# Patient Record
Sex: Female | Born: 1990 | Race: Black or African American | Hispanic: No | Marital: Married | Smoking: Never smoker
Health system: Southern US, Community
[De-identification: ages and names within clinical notes are randomized; demographics above are authoritative.]

## PROBLEM LIST (undated history)

## (undated) DIAGNOSIS — T7840XA Allergy, unspecified, initial encounter: Secondary | ICD-10-CM

## (undated) DIAGNOSIS — K2970 Gastritis, unspecified, without bleeding: Secondary | ICD-10-CM

## (undated) DIAGNOSIS — F419 Anxiety disorder, unspecified: Secondary | ICD-10-CM

## (undated) DIAGNOSIS — K219 Gastro-esophageal reflux disease without esophagitis: Secondary | ICD-10-CM

## (undated) HISTORY — DX: Gastro-esophageal reflux disease without esophagitis: K21.9

## (undated) HISTORY — DX: Allergy, unspecified, initial encounter: T78.40XA

## (undated) HISTORY — PX: COLONOSCOPY: SHX174

## (undated) HISTORY — DX: Anxiety disorder, unspecified: F41.9

## (undated) HISTORY — DX: Gastritis, unspecified, without bleeding: K29.70

---

## 2013-01-25 ENCOUNTER — Ambulatory Visit: Payer: Self-pay | Admitting: Gastroenterology

## 2017-09-05 DIAGNOSIS — K219 Gastro-esophageal reflux disease without esophagitis: Secondary | ICD-10-CM | POA: Insufficient documentation

## 2017-09-08 ENCOUNTER — Other Ambulatory Visit: Payer: Self-pay | Admitting: Family Medicine

## 2017-09-08 DIAGNOSIS — R1011 Right upper quadrant pain: Secondary | ICD-10-CM

## 2017-09-12 ENCOUNTER — Ambulatory Visit (HOSPITAL_COMMUNITY)
Admission: RE | Admit: 2017-09-12 | Discharge: 2017-09-12 | Disposition: A | Discharge status: Home / Self Care | Payer: Managed Care, Other (non HMO) | Source: Ambulatory Visit | Attending: Family Medicine | Admitting: Family Medicine

## 2017-09-12 DIAGNOSIS — R93421 Abnormal radiologic findings on diagnostic imaging of right kidney: Secondary | ICD-10-CM | POA: Diagnosis not present

## 2017-09-12 DIAGNOSIS — R1011 Right upper quadrant pain: Secondary | ICD-10-CM | POA: Insufficient documentation

## 2017-09-23 ENCOUNTER — Other Ambulatory Visit (HOSPITAL_COMMUNITY): Payer: Self-pay | Admitting: Family Medicine

## 2017-09-23 DIAGNOSIS — N200 Calculus of kidney: Secondary | ICD-10-CM

## 2017-12-31 ENCOUNTER — Telehealth: Payer: Self-pay | Admitting: Family Medicine

## 2017-12-31 NOTE — Telephone Encounter (Signed)
Copied from [place] [id number]. Topic: Quick Communication - See Telephone Encounter >> Dec 31, 2017  9:39 AM Gardiner Ramus wrote: CRM for notification. See Telephone encounter for: 12/31/17. She would like to become a pt of [facility]. She needed to get in July 23rd or 24th. Please advise [phone]

## 2017-12-31 NOTE — Telephone Encounter (Signed)
Contact patient to schedule in the next available slot or offer another provider availability.

## 2018-01-13 ENCOUNTER — Ambulatory Visit (HOSPITAL_COMMUNITY): Payer: Managed Care, Other (non HMO)

## 2018-01-21 ENCOUNTER — Encounter (HOSPITAL_COMMUNITY): Payer: Self-pay

## 2018-01-21 ENCOUNTER — Ambulatory Visit (HOSPITAL_COMMUNITY)
Admission: RE | Admit: 2018-01-21 | Discharge: 2018-01-21 | Disposition: A | Discharge status: Home / Self Care | Payer: Managed Care, Other (non HMO) | Source: Ambulatory Visit | Attending: Family Medicine | Admitting: Family Medicine

## 2018-01-21 DIAGNOSIS — N200 Calculus of kidney: Secondary | ICD-10-CM | POA: Diagnosis not present

## 2018-02-03 ENCOUNTER — Encounter: Payer: Self-pay | Admitting: Urology

## 2018-02-03 ENCOUNTER — Ambulatory Visit: Payer: Managed Care, Other (non HMO) | Admitting: Urology

## 2018-02-03 DIAGNOSIS — N200 Calculus of kidney: Secondary | ICD-10-CM

## 2018-02-03 LAB — URINALYSIS, COMPLETE
BILIRUBIN UA: NEGATIVE
Glucose, UA: NEGATIVE
Ketones, UA: NEGATIVE
LEUKOCYTES UA: NEGATIVE
Nitrite, UA: NEGATIVE
PH UA: 7 (ref 5.0–7.5)
PROTEIN UA: NEGATIVE
RBC UA: NEGATIVE
Specific Gravity, UA: 1.01 (ref 1.005–1.030)
Urobilinogen, Ur: 0.2 mg/dL (ref 0.2–1.0)

## 2018-02-03 LAB — MICROSCOPIC EXAMINATION: RBC, UA: NONE SEEN /hpf (ref 0–2)

## 2018-02-03 NOTE — Progress Notes (Signed)
   02/03/2018 1:03 PM   [name] [birth year] [id number]  Referring provider: Sharyne Peach, MD [place], [place] [address]  CC: Chronic right abdominal pain  HPI: Kathryn Allen is a 27 year old female referred from Dr. Iona Beard in consultation for right-sided abdominal pain and non-obstructing right renal stones on CT scan.  Briefly she reports an approximately 6-8 month history of intermittent right-sided abdominal pain.  She reports it feels like a "gas pain," and is worse with certain foods, as well as when she is on her menstrual cycle.  Severity is moderate, and the pain is intermittent.  Prior work-up included an abdominal ultrasound that showed no hydronephrosis and no gallbladder pathology.  She also had a CT without contrast on July 31 that showed multiple very small right renal stones, largest 45mm mid-pole, and no hydronephrosis or ureteral stones.  She denies any hematuria or family history of kidney stones.  She denies gross hematuria or other urinary symptoms.   PMH: Past Medical History:  Diagnosis Date  . Anxiety   . GERD (gastroesophageal reflux disease)     Surgical History: History reviewed. No pertinent surgical history.   Allergies: No Known Allergies  Family History: Family History  Problem Relation Age of Onset  . Bladder Cancer Neg Hx   . Kidney cancer Neg Hx     Social History: Denies EtOH and smoking  ROS: Please see flowsheet from today's date for complete review of systems.  Physical Exam: LMP 12/29/2017 (Exact Date)   Constitutional:  Alert and oriented, No acute distress. Cardiovascular: No clubbing, cyanosis, or edema. Respiratory: Normal respiratory effort, no increased work of breathing. GI: Abdomen is soft, nontender, nondistended, no abdominal masses GU: No space CVA tenderness Lymph: No cervical or inguinal lymphadenopathy. Skin: No rashes, bruises or suspicious lesions. Neurologic: Grossly intact, no focal  deficits, moving all 4 extremities. Psychiatric: Normal mood and affect.  Laboratory Data: Urinalysis today is benign, 0-5 WBCs, 0 RBCs, 0-10 epithelial cells, few bacteria, nitrite negative  Pertinent Imaging:  I have personally reviewed the CT flank, notable for no hydronephrosis or ureteral stones.  There are non-obstructing right renal stones, the largest is approximately 4 mm and in the midpole.  Assessment & Plan:   In summary, Kathryn Allen is a 27 year old female with chronic right-sided abdominal pain and multiple very small nonobstructing right renal stones.  We had a long discussion today about the etiology of her pain, and I feel strongly that these nonobstructing stones are unrelated.  I did recommend considering a pelvic or vaginal ultrasound during one of her pain episodes, especially since these tend to occur during her menstrual cycle.  Additionally, if that is negative she could consider further work-up with GI if her pain continues to be associated with certain foods.  Finally, we did discuss elective right-sided ureteroscopy with laser lithotripsy and stent placement in the future if she continued to have ongoing right-sided pain without other identified etiology.  However, I feel the risks currently outweigh any benefits.  She will follow-up on an as-needed basis  Billey Co 02/03/2018  [facility] [address], [place], [place] [address] [phone]

## 2018-02-18 ENCOUNTER — Ambulatory Visit: Payer: Managed Care, Other (non HMO) | Admitting: Family Medicine

## 2018-08-14 ENCOUNTER — Encounter: Payer: Self-pay | Admitting: Obstetrics and Gynecology

## 2018-08-27 ENCOUNTER — Encounter: Payer: Self-pay | Admitting: Obstetrics and Gynecology

## 2018-08-27 ENCOUNTER — Other Ambulatory Visit: Payer: Self-pay

## 2018-08-27 ENCOUNTER — Ambulatory Visit: Payer: Managed Care, Other (non HMO) | Admitting: Obstetrics and Gynecology

## 2018-08-27 VITALS — BP 122/80 | HR 72 | Resp 14 | Ht 62.75 in | Wt 142.2 lb

## 2018-08-27 DIAGNOSIS — Z01419 Encounter for gynecological examination (general) (routine) without abnormal findings: Secondary | ICD-10-CM | POA: Diagnosis not present

## 2018-08-27 DIAGNOSIS — Z3009 Encounter for other general counseling and advice on contraception: Secondary | ICD-10-CM

## 2018-08-27 MED ORDER — NORETHIN ACE-ETH ESTRAD-FE 1-20 MG-MCG PO TABS: 1.0000 | ORAL_TABLET | Freq: Every day | ORAL | 3 refills | Status: DC

## 2018-08-27 NOTE — Progress Notes (Signed)
28 y.o. G0P0000 Single Black or African American Not Hispanic or Latino female here for new patient exam. Patient would like to discuss birth control options as she is getting married in November.  Period Cycle (Days): 28 Period Duration (Days): 4-5  Period Pattern: Regular Menstrual Flow: Moderate, Heavy Menstrual Control: Maxi pad Menstrual Control Change Freq (Hours): changes pad 3-4x a day  Dysmenorrhea: (!) Moderate Dysmenorrhea Symptoms: Cramping, Nausea, Other (Comment)(dizziness with some cycles) Cramps can be bad.  She c/o a 14 month h/o intermittent RLQ pain, can get worse with her cycle. Felt like trapped gas. Changes with her diet. She gets it daily, very mild.  BM are normal to constipated. She sometimes has bleeding with BM. She has a h/o a colonoscopy and has internal and external hemorrhoid.  Patient's last menstrual period was 08/07/2018.          She hasn't been sexually active for 5 years.  Sexually active: No.  The current method of family planning is abstinence.    Exercising: No.  The patient does not participate in regular exercise at present. Smoker:  no  Health Maintenance: Pap:  08-01-17 negative  History of abnormal Pap:  no MMG:  n/a BMD:   n/a Colonoscopy: 01-25-13 due to GI issues TDaP:  05-11-14  Gardasil: completed x3   reports that she has never smoked. She has never used smokeless tobacco. She reports that she does not drink alcohol or use drugs. She works in Environmental health practitioner.  Past Medical History:  Diagnosis Date  . Anxiety   . GERD (gastroesophageal reflux disease)     History reviewed. No pertinent surgical history.  Current Outpatient Medications  Medication Sig Dispense Refill  . cetirizine (ZYRTEC) 10 MG tablet     . EPINEPHrine 0.3 mg/0.3 mL IJ SOAJ injection USE AS DIRECTED    . IBUPROFEN PO Take by mouth.    . Multiple Vitamins-Minerals (HAIR SKIN NAILS PO) Take by mouth.    . Multiple Vitamins-Minerals (MULTIVITAMIN PO) Take  by mouth.     No current facility-administered medications for this visit.     Family History  Problem Relation Age of Onset  . Breast cancer Maternal Grandmother   . Bladder Cancer Neg Hx   . Kidney cancer Neg Hx   MGM died at 10. She had sisters. 2 daughters, everyone else is fine  Review of Systems  Constitutional: Negative.   HENT: Negative.   Eyes: Negative.   Respiratory: Negative.   Cardiovascular: Negative.   Gastrointestinal: Positive for abdominal pain.       Abdominal pain that worsens with cycle  Endocrine: Negative.   Genitourinary: Negative.   Allergic/Immunologic: Negative.   Neurological: Negative.   Hematological: Negative.   Psychiatric/Behavioral: Negative.   She reports intermittent boils under her arms, not currently.   Exam:   BP 122/80 (BP Location: Right Arm, Patient Position: Sitting, Cuff Size: Normal)   Pulse 72   Resp 14   Ht 5' 2.75" (1.594 m)   Wt 142 lb 4 oz (64.5 kg)   LMP 08/07/2018   BMI 25.40 kg/m   Weight change: @WEIGHTCHANGE @ Height:   Height: 5' 2.75" (159.4 cm)  Ht Readings from Last 3 Encounters:  08/27/18 5' 2.75" (1.594 m)    General appearance: alert, cooperative and appears stated age Head: Normocephalic, without obvious abnormality, atraumatic Neck: no adenopathy, supple, symmetrical, trachea midline and thyroid normal to inspection and palpation Lungs: clear to auscultation bilaterally Cardiovascular: regular rate and rhythm Breasts: normal  appearance, no masses or tenderness Abdomen: soft, non-tender; non distended,  no masses,  no organomegaly Extremities: extremities normal, atraumatic, no cyanosis or edema Skin: Skin color, texture, turgor normal. No rashes or lesions Lymph nodes: Cervical, supraclavicular, and axillary nodes normal. No abnormal inguinal nodes palpated Neurologic: Grossly normal   Pelvic: External genitalia:  no lesions              Urethra:  normal appearing urethra with no masses, tenderness  or lesions              Bartholins and Skenes: normal                 Vagina: normal appearing vagina with normal color and discharge, no lesions              Cervix: no lesions               Bimanual Exam:  Uterus:  normal size, contour, position, consistency, mobility, non-tender and retroverted              Adnexa: no mass, fullness, tenderness               Rectovaginal: Confirms               Anus:  normal sphincter tone, no lesions  Chaperone was present for exam.  A:  Well Woman with normal exam  RLQ abdominal pain, intermittent, suspect GI  P:   Labs at work  No pap this year  Declines STD testing  Will start OCP's, no contraindications, risks reviewed  F/U in 3 months  Discussed breast self exam  Discussed calcium and vit D intake

## 2018-08-27 NOTE — Patient Instructions (Signed)
EXERCISE AND DIET:  We recommended that you start or continue a regular exercise program for good health. Regular exercise means any activity that makes your heart beat faster and makes you sweat.  We recommend exercising at least 30 minutes per day at least 3 days a week, preferably 4 or 5.  We also recommend a diet low in fat and sugar.  Inactivity, poor dietary choices and obesity can cause diabetes, heart attack, stroke, and kidney damage, among others.    ALCOHOL AND SMOKING:  Women should limit their alcohol intake to no more than 7 drinks/beers/glasses of wine (combined, not each!) per week. Moderation of alcohol intake to this level decreases your risk of breast cancer and liver damage. And of course, no recreational drugs are part of a healthy lifestyle.  And absolutely no smoking or even second hand smoke. Most people know smoking can cause heart and lung diseases, but did you know it also contributes to weakening of your bones? Aging of your skin?  Yellowing of your teeth and nails?  CALCIUM AND VITAMIN D:  Adequate intake of calcium and Vitamin D are recommended.  The recommendations for exact amounts of these supplements seem to change often, but generally speaking 1,000 mg of calcium (between diet and supplement) and 800 units of Vitamin D per day seems prudent. Certain women may benefit from higher intake of Vitamin D.  If you are among these women, your doctor will have told you during your visit.    PAP SMEARS:  Pap smears, to check for cervical cancer or precancers,  have traditionally been done yearly, although recent scientific advances have shown that most women can have pap smears less often.  However, every woman still should have a physical exam from her gynecologist every year. It will include a breast check, inspection of the vulva and vagina to check for abnormal growths or skin changes, a visual exam of the cervix, and then an exam to evaluate the size and shape of the uterus and  ovaries.  And after 29 years of age, a rectal exam is indicated to check for rectal cancers. We will also provide age appropriate advice regarding health maintenance, like when you should have certain vaccines, screening for sexually transmitted diseases, bone density testing, colonoscopy, mammograms, etc.   MAMMOGRAMS:  All women over 40 years old should have a yearly mammogram. Many facilities now offer a "3D" mammogram, which may cost around $50 extra out of pocket. If possible,  we recommend you accept the option to have the 3D mammogram performed.  It both reduces the number of women who will be called back for extra views which then turn out to be normal, and it is better than the routine mammogram at detecting truly abnormal areas.    COLON CANCER SCREENING: Now recommend starting at age 45. At this time colonoscopy is not covered for routine screening until 50. There are take home tests that can be done between 45-49.   COLONOSCOPY:  Colonoscopy to screen for colon cancer is recommended for all women at age 50.  We know, you hate the idea of the prep.  We agree, BUT, having colon cancer and not knowing it is worse!!  Colon cancer so often starts as a polyp that can be seen and removed at colonscopy, which can quite literally save your life!  And if your first colonoscopy is normal and you have no family history of colon cancer, most women don't have to have it again for   10 years.  Once every ten years, you can do something that may end up saving your life, right?  We will be happy to help you get it scheduled when you are ready.  Be sure to check your insurance coverage so you understand how much it will cost.  It may be covered as a preventative service at no cost, but you should check your particular policy.      Breast Self-Awareness Breast self-awareness means being familiar with how your breasts look and feel. It involves checking your breasts regularly and reporting any changes to your  health care provider. Practicing breast self-awareness is important. A change in your breasts can be a sign of a serious medical problem. Being familiar with how your breasts look and feel allows you to find any problems early, when treatment is more likely to be successful. All women should practice breast self-awareness, including women who have had breast implants. How to do a breast self-exam One way to learn what is normal for your breasts and whether your breasts are changing is to do a breast self-exam. To do a breast self-exam: Look for Changes  1. Remove all the clothing above your waist. 2. Stand in front of a mirror in a room with good lighting. 3. Put your hands on your hips. 4. Push your hands firmly downward. 5. Compare your breasts in the mirror. Look for differences between them (asymmetry), such as: ? Differences in shape. ? Differences in size. ? Puckers, dips, and bumps in one breast and not the other. 6. Look at each breast for changes in your skin, such as: ? Redness. ? Scaly areas. 7. Look for changes in your nipples, such as: ? Discharge. ? Bleeding. ? Dimpling. ? Redness. ? A change in position. Feel for Changes Carefully feel your breasts for lumps and changes. It is best to do this while lying on your back on the floor and again while sitting or standing in the shower or tub with soapy water on your skin. Feel each breast in the following way:  Place the arm on the side of the breast you are examining above your head.  Feel your breast with the other hand.  Start in the nipple area and make  inch (2 cm) overlapping circles to feel your breast. Use the pads of your three middle fingers to do this. Apply light pressure, then medium pressure, then firm pressure. The light pressure will allow you to feel the tissue closest to the skin. The medium pressure will allow you to feel the tissue that is a little deeper. The firm pressure will allow you to feel the tissue  close to the ribs.  Continue the overlapping circles, moving downward over the breast until you feel your ribs below your breast.  Move one finger-width toward the center of the body. Continue to use the  inch (2 cm) overlapping circles to feel your breast as you move slowly up toward your collarbone.  Continue the up and down exam using all three pressures until you reach your armpit.  Write Down What You Find  Write down what is normal for each breast and any changes that you find. Keep a written record with breast changes or normal findings for each breast. By writing this information down, you do not need to depend only on memory for size, tenderness, or location. Write down where you are in your menstrual cycle, if you are still menstruating. If you are having trouble noticing differences   in your breasts, do not get discouraged. With time you will become more familiar with the variations in your breasts and more comfortable with the exam. How often should I examine my breasts? Examine your breasts every month. If you are breastfeeding, the best time to examine your breasts is after a feeding or after using a breast pump. If you menstruate, the best time to examine your breasts is 5-7 days after your period is over. During your period, your breasts are lumpier, and it may be more difficult to notice changes. When should I see my health care provider? See your health care provider if you notice:  A change in shape or size of your breasts or nipples.  A change in the skin of your breast or nipples, such as a reddened or scaly area.  Unusual discharge from your nipples.  A lump or thick area that was not there before.  Pain in your breasts.  Anything that concerns you.  Oral Contraception Information Oral contraceptive pills (OCPs) are medicines taken to prevent pregnancy. OCPs are taken by mouth, and they work by:  Preventing the ovaries from releasing eggs.  Thickening mucus in  the lower part of the uterus (cervix), which prevents sperm from entering the uterus.  Thinning the lining of the uterus (endometrium), which prevents a fertilized egg from attaching to the endometrium. OCPs are highly effective when taken exactly as prescribed. However, OCPs do not prevent STIs (sexually transmitted infections). Safe sex practices, such as using condoms while on an OCP, can help prevent STIs. Before starting OCPs Before you start taking OCPs, you may have a physical exam, blood test, and Pap test. However, you are not required to have a pelvic exam in order to be prescribed OCPs. Your health care provider will make sure you are a good candidate for oral contraception. OCPs are not a good option for certain women, including women who smoke and are older than 35 years, and women with a medical history of high blood pressure, deep vein thrombosis, pulmonary embolism, stroke, cardiovascular disease, or peripheral vascular disease. Discuss with your health care provider the possible side effects of the OCP you may be prescribed. When you start an OCP, be aware that it can take 2-3 months for your body to adjust to changes in hormone levels. Follow instructions from your health care provider about how to start taking your first cycle of OCPs. Depending on when you start the pill, you may need to use a backup form of birth control, such as condoms, during the first week. Make sure you know what steps to take if you ever forget to take the pill. Types of oral contraception  The most common types of birth control pills contain the hormones estrogen and progestin (synthetic progesterone) or progestin only. The combination pill This type of pill contains estrogen and progestin hormones. Combination pills often come in packs of 21, 28, or 91 pills. For each pack, the last 7 pills may not contain hormones, which means you may stop taking the pills for 7 days. Menstrual bleeding occurs during the  week that you do not take the pills or that you take the pills with no hormones in them. The minipill This type of pill contains the progestin hormone only. It comes in packs of 28 pills. All 28 pills contain the hormone. You take the pill every day. It is very important to take the pill at the same time each day. Advantages of oral contraceptive pills    Provides reliable and continuous contraception if taken as instructed. °· May treat or decrease symptoms of: °? Menstrual period cramps. °? Irregular menstrual cycle or bleeding. °? Heavy menstrual flow. °? Abnormal uterine bleeding. °? Acne, depending on the type of pill. °? Polycystic ovarian syndrome. °? Endometriosis. °? Iron deficiency anemia. °? Premenstrual symptoms, including premenstrual dysphoric disorder. °· May reduce the risk of endometrial and ovarian cancer. °· Can be used as emergency contraception. °· Prevents mislocated (ectopic) pregnancies and infections of the fallopian tubes. °Things that can make oral contraceptive pills less effective °OCPs can be less effective if: °· You forget to take the pill at the same time every day. This is especially important when taking the minipill. °· You have a stomach or intestinal disease that reduces your body's ability to absorb the pill. °· You take OCPs with other medicines that make OCPs less effective, such as antibiotics, certain HIV medicines, and some seizure medicines. °· You take expired OCPs. °· You forget to restart the pill on day 7, if using the packs of 21 pills. °Risks associated with oral contraceptive pills °Oral contraceptive pills can sometimes cause side effects, such as: °· Headache. °· Depression. °· Trouble sleeping. °· Nausea and vomiting. °· Breast tenderness. °· Irregular bleeding or spotting during the first several months. °· Bloating or fluid retention. °· Increase in blood pressure. °Combination pills are also associated with a small increase in the risk of: °· Blood  clots. °· Heart attack. °· Stroke. °Summary °· Oral contraceptive pills are medicines taken by mouth to prevent pregnancy. They are highly effective when taken exactly as prescribed. °· The most common types of birth control pills contain the hormones estrogen and progestin (synthetic progesterone) or progestin only. °· Before you start taking the pill, you may have a physical exam, blood test, and Pap test. Your health care provider will make sure you are a good candidate for oral contraception. °· The combination pill may come in a 21-day pack, a 28-day pack, or a 91-day pack. The minipill contains the progesterone hormone only and comes in packs of 28 pills. °· Oral contraceptive pills can sometimes cause side effects, such as headache, nausea, breast tenderness, or irregular bleeding. °This information is not intended to replace advice given to you by your health care provider. Make sure you discuss any questions you have with your health care provider. °Document Released: 08/31/2002 Document Revised: 09/03/2016 Document Reviewed: 09/03/2016 °Elsevier Interactive Patient Education © 2019 [employer] ° °

## 2018-11-27 ENCOUNTER — Telehealth: Payer: Self-pay | Admitting: Obstetrics and Gynecology

## 2018-11-27 NOTE — Telephone Encounter (Signed)
Spoke with patient and advised to keep 3 month follow up appointment after starting OCPs. Advised patient we need to recheck vitals to be sure they are not elevated with OCPs. She thanked me for returning her call.

## 2018-11-27 NOTE — Telephone Encounter (Signed)
Patient has a 3 month medication follow up appointment 12/02/18. She asked if she "really needs this appointment or can this be handled over the phone"?Marland Kitchen

## 2018-11-30 NOTE — Progress Notes (Signed)
GYNECOLOGY  VISIT   HPI: 28 y.o.   Single Black or African American Not Hispanic or Latino  female   G0P0000 with Patient's last menstrual period was 11/22/2018 (approximate).   here for 3 month follow up on OCP. She is getting married in 11/20 and doesn't plan to be sexually active until then.  Cycles are monthly x 4 day, saturating a pad every 4 hours (lighter). Cramps are still bad for the first 1-2 days, controlled with ibuprofen.  She continues to have constipation with her cycle, feels weak and fatigued for a couple of days of her cycle.  She has had BTB the last 2 cycles, but has taken her pills late. She was having some issues with taking her pill at the same time. She has set an alarm and is trying to take it more consistently. No other side effects.   GYNECOLOGIC HISTORY: Patient's last menstrual period was 11/22/2018 (approximate). Contraception: OCP  Menopausal hormone therapy: None        OB History    Gravida  0   Para  0   Term  0   Preterm  0   AB  0   Living  0     SAB  0   TAB  0   Ectopic  0   Multiple  0   Live Births  0              There are no active problems to display for this patient.   Past Medical History:  Diagnosis Date  . Anxiety   . GERD (gastroesophageal reflux disease)     History reviewed. No pertinent surgical history.  Current Outpatient Medications  Medication Sig Dispense Refill  . cetirizine (ZYRTEC) 10 MG tablet     . EPINEPHrine 0.3 mg/0.3 mL IJ SOAJ injection USE AS DIRECTED    . IBUPROFEN PO Take by mouth.    . norethindrone-ethinyl estradiol (JUNEL FE,GILDESS FE,LOESTRIN FE) 1-20 MG-MCG tablet Take 1 tablet by mouth daily. 3 Package 3   No current facility-administered medications for this visit.      ALLERGIES: Shellfish allergy  Family History  Problem Relation Age of Onset  . Breast cancer Maternal Grandmother   . Bladder Cancer Neg Hx   . Kidney cancer Neg Hx     Social History    Socioeconomic History  . Marital status: Single    Spouse name: Not on file  . Number of children: Not on file  . Years of education: Not on file  . Highest education level: Not on file  Occupational History  . Not on file  Social Needs  . Financial resource strain: Not on file  . Food insecurity:    Worry: Not on file    Inability: Not on file  . Transportation needs:    Medical: Not on file    Non-medical: Not on file  Tobacco Use  . Smoking status: Never Smoker  . Smokeless tobacco: Never Used  Substance and Sexual Activity  . Alcohol use: Never    Frequency: Never  . Drug use: Never  . Sexual activity: Not Currently    Birth control/protection: Abstinence, Pill  Lifestyle  . Physical activity:    Days per week: Not on file    Minutes per session: Not on file  . Stress: Not on file  Relationships  . Social connections:    Talks on phone: Not on file    Gets together: Not on file  Attends religious service: Not on file    Active member of club or organization: Not on file    Attends meetings of clubs or organizations: Not on file    Relationship status: Not on file  . Intimate partner violence:    Fear of current or ex partner: Not on file    Emotionally abused: Not on file    Physically abused: Not on file    Forced sexual activity: Not on file  Other Topics Concern  . Not on file  Social History Narrative  . Not on file    Review of Systems  Constitutional: Negative.   HENT: Negative.   Eyes: Negative.   Respiratory: Negative.   Cardiovascular: Negative.   Gastrointestinal: Negative.   Genitourinary:       Spotting before menses  Musculoskeletal: Negative.   Skin: Negative.   Neurological: Negative.   Endo/Heme/Allergies: Negative.   Psychiatric/Behavioral: Negative.     PHYSICAL EXAMINATION:    BP 118/82 (BP Location: Left Arm, Patient Position: Sitting, Cuff Size: Normal)   Pulse 72   Temp 97.9 F (36.6 C) (Skin)   Wt 146 lb 9.6 oz  (66.5 kg)   LMP 11/22/2018 (Approximate)   BMI 26.18 kg/m     General appearance: alert, cooperative and appears stated age  ASSESSMENT Contraception surveillance Dysmenorrhea    PLAN Continue OCP's Will call in Ibuprofen 800 mg F/U annual 3/21 She is getting married in 11/20, wants to adjust her cycle to not be bleeding during her wedding (she will call a few months prior, we discussed how to adjust her cycle, may need an extra pack of pills)   An After Visit Summary was printed and given to the patient.  ~20 minutes face to face time of which over 50% was spent in counseling.

## 2018-12-02 ENCOUNTER — Encounter: Payer: Self-pay | Admitting: Obstetrics and Gynecology

## 2018-12-02 ENCOUNTER — Other Ambulatory Visit: Payer: Self-pay

## 2018-12-02 ENCOUNTER — Ambulatory Visit (INDEPENDENT_AMBULATORY_CARE_PROVIDER_SITE_OTHER): Payer: Managed Care, Other (non HMO) | Admitting: Obstetrics and Gynecology

## 2018-12-02 VITALS — BP 118/82 | HR 72 | Temp 97.9°F | Wt 146.6 lb

## 2018-12-02 DIAGNOSIS — N946 Dysmenorrhea, unspecified: Secondary | ICD-10-CM | POA: Diagnosis not present

## 2018-12-02 DIAGNOSIS — Z3041 Encounter for surveillance of contraceptive pills: Secondary | ICD-10-CM

## 2018-12-02 MED ORDER — IBUPROFEN 800 MG PO TABS: 800.0000 mg | ORAL_TABLET | Freq: Three times a day (TID) | ORAL | 1 refills | Status: DC | PRN

## 2019-02-05 ENCOUNTER — Telehealth: Payer: Self-pay | Admitting: Obstetrics and Gynecology

## 2019-02-05 NOTE — Telephone Encounter (Signed)
I would not recommend changing her contraception this close to her wedding. Taking one pill late could cause spotting for the rest of the month. She should do her very best to take her pills at the same time daily.

## 2019-02-05 NOTE — Telephone Encounter (Signed)
Call to patient. Patient states she has been taking Junel since 08/2018. Patient states her periods were regular prior to starting OCP's. Patient states LMP was 01-17-2019, states she isn't due for cycle for about 10 more days, but started spotting today. Has missed 2 pills this pack, but doubled up the next day. Patient states that on OCP's she will spot for about 1 week prior to starting her actual period. Patient states she is currently on week 3, day 1 of her pack. Patient is concerned because she is not currently SA, but has moved her wedding date up and is getting married in 3 weeks. Patient states she planned her wedding around her birth control and picked a date for week 1 of her pack. RN advised breakthrough bleeding could be from missed doses and her body adjusting to hormones. Patient declined OV for birth control consult. Patient open to trying other options for birth control, but not sure she should make any changes now with her wedding coming up. RN advised would review with Dr. Talbert Nan and return call with recommendations. Patient agreeable. Aware a response might not be until Monday. Patient okay with plan.   Routing to provider for review.

## 2019-02-05 NOTE — Telephone Encounter (Signed)
Patient is having abnormal bleeding on birth control.

## 2019-02-08 NOTE — Telephone Encounter (Signed)
Call to patient. Message given to patient as seen below from Dr. Talbert Nan. Patient verbalized understanding and agreeable.   Will close encounter.

## 2019-03-31 ENCOUNTER — Telehealth: Payer: Self-pay | Admitting: Obstetrics and Gynecology

## 2019-03-31 NOTE — Telephone Encounter (Signed)
Call to patient. Patient states she is interested in switching contraception. States, "I need to do something." States she normally spots 2 weeks prior to her period and this has been ongoing since March. This past month her period started on 03-15-2019 and she only had 1 full day of bleeding. Days 2-3 were only spotting and not enough to wear anything more than a panty liner. Took UPT this past Sunday and was negative. OV recommended to discuss concerns with contraception. Patient agreeable. Patient requesting early am/late afternoon next week. Patient scheduled for 04-07-2019 at 1600. Patient agreeable to date and time of appointment.   Routing to provider and will close encounter.

## 2019-03-31 NOTE — Telephone Encounter (Signed)
Patient want to speak with nurse about switching birth control.

## 2019-04-05 NOTE — Progress Notes (Signed)
GYNECOLOGY  VISIT   HPI: 28 y.o.   Single Black or African American Not Hispanic or Latino  female   G0P0000 with Patient's last menstrual period was 03/15/2019 (exact date).   here to discuss birth control options. The patient was started on OCP's in 3/20, she moved her wedding up from 11/20 to 9/20. She isn't sure if she likes her pills. Last month her cycle was light and short with minimal cramping. She was more consistent with the timing of taking her pill. Prior to last month she didn't feel her cycles were any better.  Her biggest worry is intermenstrual spotting. She spots for 1.5 weeks prior to getting her cycle. It is dark brown. It's annoying.   GYNECOLOGIC HISTORY: Patient's last menstrual period was 03/15/2019 (exact date). Contraception:OCP Menopausal hormone therapy: None        OB History    Gravida  0   Para  0   Term  0   Preterm  0   AB  0   Living  0     SAB  0   TAB  0   Ectopic  0   Multiple  0   Live Births  0              There are no active problems to display for this patient.   Past Medical History:  Diagnosis Date  . Anxiety   . GERD (gastroesophageal reflux disease)     History reviewed. No pertinent surgical history.  Current Outpatient Medications  Medication Sig Dispense Refill  . cetirizine (ZYRTEC) 10 MG tablet     . EPINEPHrine 0.3 mg/0.3 mL IJ SOAJ injection USE AS DIRECTED    . ibuprofen (ADVIL) 800 MG tablet Take 1 tablet (800 mg total) by mouth every 8 (eight) hours as needed. 30 tablet 1  . norethindrone-ethinyl estradiol (JUNEL FE,GILDESS FE,LOESTRIN FE) 1-20 MG-MCG tablet Take 1 tablet by mouth daily. 3 Package 3   No current facility-administered medications for this visit.      ALLERGIES: Shellfish allergy  Family History  Problem Relation Age of Onset  . Breast cancer Maternal Grandmother   . Bladder Cancer Neg Hx   . Kidney cancer Neg Hx     Social History   Socioeconomic History  . Marital status:  Single    Spouse name: Not on file  . Number of children: Not on file  . Years of education: Not on file  . Highest education level: Not on file  Occupational History  . Not on file  Social Needs  . Financial resource strain: Not on file  . Food insecurity    Worry: Not on file    Inability: Not on file  . Transportation needs    Medical: Not on file    Non-medical: Not on file  Tobacco Use  . Smoking status: Never Smoker  . Smokeless tobacco: Never Used  Substance and Sexual Activity  . Alcohol use: Never    Frequency: Never  . Drug use: Never  . Sexual activity: Yes    Birth control/protection: Pill  Lifestyle  . Physical activity    Days per week: Not on file    Minutes per session: Not on file  . Stress: Not on file  Relationships  . Social Herbalist on phone: Not on file    Gets together: Not on file    Attends religious service: Not on file    Active member of club  or organization: Not on file    Attends meetings of clubs or organizations: Not on file    Relationship status: Not on file  . Intimate partner violence    Fear of current or ex partner: Not on file    Emotionally abused: Not on file    Physically abused: Not on file    Forced sexual activity: Not on file  Other Topics Concern  . Not on file  Social History Narrative  . Not on file    Review of Systems  Constitutional: Negative.   HENT: Negative.   Eyes: Negative.   Respiratory: Negative.   Cardiovascular: Negative.   Gastrointestinal: Negative.   Genitourinary:       Irregular bleeding with menses  Musculoskeletal: Negative.   Skin: Negative.   Neurological: Negative.   Endo/Heme/Allergies: Negative.   Psychiatric/Behavioral: Negative.     PHYSICAL EXAMINATION:    BP 122/78 (BP Location: Right Arm, Patient Position: Sitting, Cuff Size: Normal)   Pulse 64   Temp (!) 97 F (36.1 C) (Skin)   Wt 147 lb 9.6 oz (67 kg)   LMP 03/15/2019 (Exact Date)   BMI 26.35 kg/m      General appearance: alert, cooperative and appears stated age   ASSESSMENT BTB on OCP's    PLAN Will change to loestrin 1.5/30 Call if she is continuing to have a problem Otherwise f/u for annual exam in 3/21   An After Visit Summary was printed and given to the patient.

## 2019-04-07 ENCOUNTER — Ambulatory Visit (INDEPENDENT_AMBULATORY_CARE_PROVIDER_SITE_OTHER): Payer: Managed Care, Other (non HMO) | Admitting: Obstetrics and Gynecology

## 2019-04-07 ENCOUNTER — Encounter: Payer: Self-pay | Admitting: Obstetrics and Gynecology

## 2019-04-07 ENCOUNTER — Other Ambulatory Visit: Payer: Self-pay

## 2019-04-07 VITALS — BP 122/78 | HR 64 | Temp 97.0°F | Wt 147.6 lb

## 2019-04-07 DIAGNOSIS — N921 Excessive and frequent menstruation with irregular cycle: Secondary | ICD-10-CM

## 2019-04-07 MED ORDER — NORETHINDRONE ACET-ETHINYL EST 1.5-30 MG-MCG PO TABS: 1.0000 | ORAL_TABLET | Freq: Every day | ORAL | 1 refills | Status: DC

## 2019-04-14 ENCOUNTER — Telehealth: Payer: Self-pay | Admitting: Obstetrics and Gynecology

## 2019-04-14 NOTE — Telephone Encounter (Signed)
Call to patient. Patient states she opened her new pack of OCP and only had 21 pills. Patient asking how to take. Patient states she doesn't want to take them continuously. RN advised could take the same as old prescription just wouldn't have the placebo pills to take. Patient states she never took those anyway. Verbalized understanding and appreciative of phone call.   Routing to provider and will close encounter.

## 2019-04-14 NOTE — Telephone Encounter (Signed)
Patient has a question about medication

## 2019-06-10 ENCOUNTER — Other Ambulatory Visit: Payer: Managed Care, Other (non HMO)

## 2019-06-10 ENCOUNTER — Ambulatory Visit: Payer: Managed Care, Other (non HMO) | Attending: Internal Medicine

## 2019-06-10 DIAGNOSIS — Z20822 Contact with and (suspected) exposure to covid-19: Secondary | ICD-10-CM

## 2019-06-11 LAB — NOVEL CORONAVIRUS, NAA: SARS-CoV-2, NAA: NOT DETECTED

## 2019-07-13 ENCOUNTER — Telehealth: Payer: Self-pay | Admitting: Obstetrics and Gynecology

## 2019-07-13 ENCOUNTER — Encounter: Payer: Self-pay | Admitting: Obstetrics and Gynecology

## 2019-07-13 NOTE — Telephone Encounter (Signed)
Patient sent the following message through [facility]. Routing to triage to assist patient with request.  Kathryn Allen [facility]  Phone Number: [phone]  Hello,  I am about to start a new vitamin that is supposed to help with PMS. It's called FloVitamins. Just want to make sure this is safe to take in conjunction with my BC pills.   Thanks,  Kathryn Allen

## 2019-07-13 NOTE — Telephone Encounter (Signed)
Routing to Dr. Jertson to review and advise.  

## 2019-07-14 NOTE — Telephone Encounter (Signed)
Results and any recommendations were sent via [facility]. No contraindication to using routine vitamins with OCP's.

## 2019-09-13 ENCOUNTER — Other Ambulatory Visit: Payer: Self-pay | Admitting: Obstetrics and Gynecology

## 2019-09-29 ENCOUNTER — Other Ambulatory Visit: Payer: Self-pay | Admitting: Obstetrics and Gynecology

## 2019-09-30 ENCOUNTER — Other Ambulatory Visit: Payer: Self-pay | Admitting: *Deleted

## 2019-09-30 MED ORDER — NORETHINDRONE ACET-ETHINYL EST 1.5-30 MG-MCG PO TABS: 1.0000 | ORAL_TABLET | Freq: Every day | ORAL | 0 refills | Status: DC

## 2019-09-30 NOTE — Telephone Encounter (Signed)
Medication refill request: Loestrin  Last AEX:  08-27-2018 JJ  Next AEX: 11-04-19 Last MMG (if hormonal medication request): n/a Refill authorized: Today, please advise.   Medication pended for #3, 0RF. Please refill if appropriate.

## 2019-11-04 ENCOUNTER — Encounter: Payer: Self-pay | Admitting: Obstetrics and Gynecology

## 2019-11-04 ENCOUNTER — Ambulatory Visit: Payer: Managed Care, Other (non HMO) | Admitting: Obstetrics and Gynecology

## 2019-11-04 ENCOUNTER — Other Ambulatory Visit: Payer: Self-pay

## 2019-11-04 VITALS — BP 128/70 | HR 84 | Temp 97.6°F | Resp 10 | Ht 62.25 in | Wt 151.0 lb

## 2019-11-04 DIAGNOSIS — Z01419 Encounter for gynecological examination (general) (routine) without abnormal findings: Secondary | ICD-10-CM

## 2019-11-04 DIAGNOSIS — Z3169 Encounter for other general counseling and advice on procreation: Secondary | ICD-10-CM | POA: Diagnosis not present

## 2019-11-04 DIAGNOSIS — Z113 Encounter for screening for infections with a predominantly sexual mode of transmission: Secondary | ICD-10-CM

## 2019-11-04 MED ORDER — NORETHINDRONE ACET-ETHINYL EST 1.5-30 MG-MCG PO TABS: 1.0000 | ORAL_TABLET | Freq: Every day | ORAL | 3 refills | Status: DC

## 2019-11-04 NOTE — Addendum Note (Signed)
Addended by: Dorothy Spark on: 11/04/2019 04:22 PM   Modules accepted: Orders

## 2019-11-04 NOTE — Progress Notes (Signed)
29 y.o. Kathryn Allen Married Black or [place] American Not Hispanic or Latino female here for annual exam.   In the fall her OCP's were changed from loestrin 1/20 to loestrin 1.5/30 secondary to BTB.  Doing better on this pill.  Period Cycle (Days): 28 Period Duration (Days): 3 Period Pattern: Regular Menstrual Flow: Moderate, Light Menstrual Control: Thin pad Menstrual Control Change Freq (Hours): 3 Dysmenorrhea: (!) Mild Dysmenorrhea Symptoms: Cramping, Throbbing, Nausea, Headache  Increase in gas pain with her cycles, some constipation or diarrhea with her cycle.  She is considering pregnancy in the next year or two. No FH of any genetic or chromosomal abnormalities.   She hasn't had her covid vaccine.   Intermittent issues with hemorrhoids   Patient's last menstrual period was 10/25/2019.          Sexually active: Yes.    The current method of family planning is JUNEL FE.    Exercising: No.  The patient does not participate in regular exercise at present. Smoker:  no  Health Maintenance: Pap:   08-01-17 negative  History of abnormal Pap:  no Colonoscopy:  01-25-13 due to GI issues TDaP:  05/11/14 Gardasil: completed   reports that she has never smoked. She has never used smokeless tobacco. She reports that she does not drink alcohol or use drugs. She works in Environmental health practitioner.  Past Medical History:  Diagnosis Date  . Anxiety   . GERD (gastroesophageal reflux disease)     History reviewed. No pertinent surgical history.  Current Outpatient Medications  Medication Sig Dispense Refill  . cetirizine (ZYRTEC) 10 MG tablet     . EPINEPHrine 0.3 mg/0.3 mL IJ SOAJ injection USE AS DIRECTED    . ibuprofen (ADVIL) 800 MG tablet Take 1 tablet (800 mg total) by mouth every 8 (eight) hours as needed. 30 tablet 1  . Norethindrone Acetate-Ethinyl Estradiol (LOESTRIN 1.5/30, 21,) 1.5-30 MG-MCG tablet Take 1 tablet by mouth daily. 3 Package 0   No current facility-administered  medications for this visit.    Family History  Problem Relation Age of Onset  . Breast cancer Maternal Grandmother   . Bladder Cancer Neg Hx   . Kidney cancer Neg Hx     Review of Systems  Constitutional: Negative.   HENT: Negative.   Eyes: Negative.   Respiratory: Negative.   Cardiovascular: Negative.   Gastrointestinal: Negative.   Endocrine: Negative.   Genitourinary: Negative.   Musculoskeletal: Negative.   Skin: Negative.   Allergic/Immunologic: Negative.   Neurological: Negative.   Hematological: Negative.   Psychiatric/Behavioral: Negative.     Exam:   BP 128/70 (BP Location: Right Arm, Patient Position: Sitting, Cuff Size: Normal)   Pulse 84   Temp 97.6 F (36.4 C) (Temporal)   Resp 10   Ht 5' 2.25" (1.581 m)   Wt 151 lb (68.5 kg)   LMP 10/25/2019   BMI 27.40 kg/m   Weight change: @WEIGHTCHANGE @ Height:   Height: 5' 2.25" (158.1 cm)  Ht Readings from Last 3 Encounters:  11/04/19 5' 2.25" (1.581 m)  08/27/18 5' 2.75" (1.594 m)    General appearance: alert, cooperative and appears stated age Head: Normocephalic, without obvious abnormality, atraumatic Neck: no adenopathy, supple, symmetrical, trachea midline and thyroid normal to inspection and palpation Lungs: clear to auscultation bilaterally Cardiovascular: regular rate and rhythm Breasts: normal appearance, no masses or tenderness Abdomen: soft, non-tender; non distended,  no masses,  no organomegaly Extremities: extremities normal, atraumatic, no cyanosis or edema Skin: Skin color, texture,  turgor normal. No rashes or lesions Lymph nodes: Cervical, supraclavicular, and axillary nodes normal. No abnormal inguinal nodes palpated Neurologic: Grossly normal   Pelvic: External genitalia:  no lesions              Urethra:  normal appearing urethra with no masses, tenderness or lesions              Bartholins and Skenes: normal                 Vagina: normal appearing vagina with normal color and  discharge, no lesions              Cervix: no lesions               Bimanual Exam:  Uterus:  normal size, contour, position, consistency, mobility, non-tender              Adnexa: no mass, fullness, tenderness               Rectovaginal: Confirms               Anus:  normal sphincter tone, no lesions  Terence Lux chaperoned for the exam.  A:  Well Woman with normal exam  Contraception surveillance   Considering pregnancy in the next 1-2 years  P:   No pap this year  Discussed breast self exam  Discussed calcium and vit D intake  Screening labs at work, reports they were normal.   Continue OCP's  Start PNV prior to stopping OCP's  Check varicella zoster and rubella immunity.   Information on preparing for pregnancy given

## 2019-11-04 NOTE — Patient Instructions (Signed)
EXERCISE AND DIET:  We recommended that you start or continue a regular exercise program for good health. Regular exercise means any activity that makes your heart beat faster and makes you sweat.  We recommend exercising at least 30 minutes per day at least 3 days a week, preferably 4 or 5.  We also recommend a diet low in fat and sugar.  Inactivity, poor dietary choices and obesity can cause diabetes, heart attack, stroke, and kidney damage, among others.    ALCOHOL AND SMOKING:  Women should limit their alcohol intake to no more than 7 drinks/beers/glasses of wine (combined, not each!) per week. Moderation of alcohol intake to this level decreases your risk of breast cancer and liver damage. And of course, no recreational drugs are part of a healthy lifestyle.  And absolutely no smoking or even second hand smoke. Most people know smoking can cause heart and lung diseases, but did you know it also contributes to weakening of your bones? Aging of your skin?  Yellowing of your teeth and nails?  CALCIUM AND VITAMIN D:  Adequate intake of calcium and Vitamin D are recommended.  The recommendations for exact amounts of these supplements seem to change often, but generally speaking 1,000 mg of calcium (between diet and supplement) and 800 units of Vitamin D per day seems prudent. Certain women may benefit from higher intake of Vitamin D.  If you are among these women, your doctor will have told you during your visit.    PAP SMEARS:  Pap smears, to check for cervical cancer or precancers,  have traditionally been done yearly, although recent scientific advances have shown that most women can have pap smears less often.  However, every woman still should have a physical exam from her gynecologist every year. It will include a breast check, inspection of the vulva and vagina to check for abnormal growths or skin changes, a visual exam of the cervix, and then an exam to evaluate the size and shape of the uterus and  ovaries.  And after 30 years of age, a rectal exam is indicated to check for rectal cancers. We will also provide age appropriate advice regarding health maintenance, like when you should have certain vaccines, screening for sexually transmitted diseases, bone density testing, colonoscopy, mammograms, etc.   MAMMOGRAMS:  All women over 40 years old should have a yearly mammogram. Many facilities now offer a "3D" mammogram, which may cost around $50 extra out of pocket. If possible,  we recommend you accept the option to have the 3D mammogram performed.  It both reduces the number of women who will be called back for extra views which then turn out to be normal, and it is better than the routine mammogram at detecting truly abnormal areas.    COLON CANCER SCREENING: Now recommend starting at age 45. At this time colonoscopy is not covered for routine screening until 50. There are take home tests that can be done between 45-49.   COLONOSCOPY:  Colonoscopy to screen for colon cancer is recommended for all women at age 50.  We know, you hate the idea of the prep.  We agree, BUT, having colon cancer and not knowing it is worse!!  Colon cancer so often starts as a polyp that can be seen and removed at colonscopy, which can quite literally save your life!  And if your first colonoscopy is normal and you have no family history of colon cancer, most women don't have to have it again for   10 years.  Once every ten years, you can do something that may end up saving your life, right?  We will be happy to help you get it scheduled when you are ready.  Be sure to check your insurance coverage so you understand how much it will cost.  It may be covered as a preventative service at no cost, but you should check your particular policy.      Breast Self-Awareness Breast self-awareness means being familiar with how your breasts look and feel. It involves checking your breasts regularly and reporting any changes to your  health care provider. Practicing breast self-awareness is important. A change in your breasts can be a sign of a serious medical problem. Being familiar with how your breasts look and feel allows you to find any problems early, when treatment is more likely to be successful. All women should practice breast self-awareness, including women who have had breast implants. How to do a breast self-exam One way to learn what is normal for your breasts and whether your breasts are changing is to do a breast self-exam. To do a breast self-exam: Look for Changes  1. Remove all the clothing above your waist. 2. Stand in front of a mirror in a room with good lighting. 3. Put your hands on your hips. 4. Push your hands firmly downward. 5. Compare your breasts in the mirror. Look for differences between them (asymmetry), such as: ? Differences in shape. ? Differences in size. ? Puckers, dips, and bumps in one breast and not the other. 6. Look at each breast for changes in your skin, such as: ? Redness. ? Scaly areas. 7. Look for changes in your nipples, such as: ? Discharge. ? Bleeding. ? Dimpling. ? Redness. ? A change in position. Feel for Changes Carefully feel your breasts for lumps and changes. It is best to do this while lying on your back on the floor and again while sitting or standing in the shower or tub with soapy water on your skin. Feel each breast in the following way:  Place the arm on the side of the breast you are examining above your head.  Feel your breast with the other hand.  Start in the nipple area and make  inch (2 cm) overlapping circles to feel your breast. Use the pads of your three middle fingers to do this. Apply light pressure, then medium pressure, then firm pressure. The light pressure will allow you to feel the tissue closest to the skin. The medium pressure will allow you to feel the tissue that is a little deeper. The firm pressure will allow you to feel the tissue  close to the ribs.  Continue the overlapping circles, moving downward over the breast until you feel your ribs below your breast.  Move one finger-width toward the center of the body. Continue to use the  inch (2 cm) overlapping circles to feel your breast as you move slowly up toward your collarbone.  Continue the up and down exam using all three pressures until you reach your armpit.  Write Down What You Find  Write down what is normal for each breast and any changes that you find. Keep a written record with breast changes or normal findings for each breast. By writing this information down, you do not need to depend only on memory for size, tenderness, or location. Write down where you are in your menstrual cycle, if you are still menstruating. If you are having trouble noticing differences   in your breasts, do not get discouraged. With time you will become more familiar with the variations in your breasts and more comfortable with the exam. How often should I examine my breasts? Examine your breasts every month. If you are breastfeeding, the best time to examine your breasts is after a feeding or after using a breast pump. If you menstruate, the best time to examine your breasts is 5-7 days after your period is over. During your period, your breasts are lumpier, and it may be more difficult to notice changes. When should I see my health care provider? See your health care provider if you notice:  A change in shape or size of your breasts or nipples.  A change in the skin of your breast or nipples, such as a reddened or scaly area.  Unusual discharge from your nipples.  A lump or thick area that was not there before.  Pain in your breasts.  Anything that concerns you.   Preparing for Pregnancy If you are considering becoming pregnant, make an appointment to see your regular health care provider to learn how to prepare for a safe and healthy pregnancy (preconception care). During a  preconception care visit, your health care provider will:  Do a complete physical exam, including a Pap test.  Take a complete medical history.  Give you information, answer your questions, and help you resolve problems. Preconception checklist Medical history  Tell your health care provider about any current or past medical conditions. Your pregnancy or your ability to become pregnant may be affected by chronic conditions, such as diabetes, chronic hypertension, and thyroid problems.  Include your family's medical history as well as your partner's medical history.  Tell your health care provider about any history of STIs (sexually transmitted infections).These can affect your pregnancy. In some cases, they can be passed to your baby. Discuss any concerns that you have about STIs.  If indicated, discuss the benefits of genetic testing. This testing will show whether there are any genetic conditions that may be passed from you or your partner to your baby.  Tell your health care provider about: ? Any problems you have had with conception or pregnancy. ? Any medicines you take. These include vitamins, herbal supplements, and over-the-counter medicines. ? Your history of immunizations. Discuss any vaccinations that you may need. Diet  Ask your health care provider what to include in a healthy diet that has a balance of nutrients. This is especially important when you are pregnant or preparing to become pregnant.  Ask your health care provider to help you reach a healthy weight before pregnancy. ? If you are overweight, you may be at higher risk for certain complications, such as high blood pressure, diabetes, and preterm birth. ? If you are underweight, you are more likely to have a baby who has a low birth weight. Lifestyle, work, and home  Let your health care provider know: ? About any lifestyle habits that you have, such as alcohol use, drug use, or smoking. ? About recreational  activities that may put you at risk during pregnancy, such as downhill skiing and certain exercise programs. ? Tell your health care provider about any international travel, especially any travel to places with an active Zika virus outbreak. ? About harmful substances that you may be exposed to at work or at home. These include chemicals, pesticides, radiation, or even litter boxes. ? If you do not feel safe at home. Mental health  Tell your health care provider   about: ? Any history of mental health conditions, including feelings of depression, sadness, or anxiety. ? Any medicines that you take for a mental health condition. These include herbs and supplements. Home instructions to prepare for pregnancy Lifestyle   Eat a balanced diet. This includes fresh fruits and vegetables, whole grains, lean meats, low-fat dairy products, healthy fats, and foods that are high in fiber. Ask to meet with a nutritionist or registered dietitian for assistance with meal planning and goals.  Get regular exercise. Try to be active for at least 30 minutes a day on most days of the week. Ask your health care provider which activities are safe during pregnancy.  Do not use any products that contain nicotine or tobacco, such as cigarettes and e-cigarettes. If you need help quitting, ask your health care provider.  Do not drink alcohol.  Do not take illegal drugs.  Maintain a healthy weight. Ask your health care provider what weight range is right for you. General instructions  Keep an accurate record of your menstrual periods. This makes it easier for your health care provider to determine your baby's due date.  Begin taking prenatal vitamins and folic acid supplements daily as directed by your health care provider.  Manage any chronic conditions, such as high blood pressure and diabetes, as told by your health care provider. This is important. How do I know that I am pregnant? You may be pregnant if you  have been sexually active and you miss your period. Symptoms of early pregnancy include:  Mild cramping.  Very light vaginal bleeding (spotting).  Feeling unusually tired.  Nausea and vomiting (morning sickness). If you have any of these symptoms and you suspect that you might be pregnant, you can take a home pregnancy test. These tests check for a hormone in your urine (human chorionic gonadotropin, or hCG). A woman's body begins to make this hormone during early pregnancy. These tests are very accurate. Wait until at least the first day after you miss your period to take one. If the test shows that you are pregnant (you get a positive result), call your health care provider to make an appointment for prenatal care. What should I do if I become pregnant?      Make an appointment with your health care provider as soon as you suspect you are pregnant.  Do not use any products that contain nicotine, such as cigarettes, chewing tobacco, and e-cigarettes. If you need help quitting, ask your health care provider.  Do not drink alcoholic beverages. Alcohol is related to a number of birth defects.  Avoid toxic odors and chemicals.  You may continue to have sexual intercourse if it does not cause pain or other problems, such as vaginal bleeding. This information is not intended to replace advice given to you by your health care provider. Make sure you discuss any questions you have with your health care provider. Document Revised: 06/12/2017 Document Reviewed: 12/31/2015 Elsevier Patient Education  2020 [employer]  

## 2019-11-05 ENCOUNTER — Encounter: Payer: Self-pay | Admitting: Obstetrics and Gynecology

## 2019-11-05 LAB — VARICELLA ZOSTER ANTIBODY, IGG: Varicella zoster IgG: 248 index (ref >165)

## 2019-11-05 LAB — RUBELLA SCREEN: Rubella Antibodies, IGG: 4.07 index (ref >0.99)

## 2019-11-06 LAB — GC/CHLAMYDIA PROBE AMP
Chlamydia trachomatis, NAA: NEGATIVE
Neisseria Gonorrhoeae by PCR: NEGATIVE

## 2019-11-08 ENCOUNTER — Telehealth: Payer: Self-pay | Admitting: Obstetrics and Gynecology

## 2019-11-08 NOTE — Telephone Encounter (Signed)
Kathryn Allen, [facility]  Phone Number: [phone]  I am strongly considering getting the Covid 19 vaccination next week. As I am wanting to get pregnant by next year, I am wondering if there is any possibility that the vaccine will affect my fertility?   Thank you.

## 2019-11-08 NOTE — Telephone Encounter (Signed)
mychart message sent

## 2019-11-08 NOTE — Telephone Encounter (Signed)
Routing MyChart message to Dr. Talbert Nan to review.

## 2019-11-13 ENCOUNTER — Ambulatory Visit: Payer: Managed Care, Other (non HMO)

## 2020-02-03 ENCOUNTER — Encounter: Payer: Self-pay | Admitting: Physician Assistant

## 2020-03-17 ENCOUNTER — Encounter: Payer: Self-pay | Admitting: Physician Assistant

## 2020-03-17 ENCOUNTER — Ambulatory Visit: Payer: Managed Care, Other (non HMO) | Admitting: Physician Assistant

## 2020-03-17 VITALS — BP 122/80 | HR 72 | Ht 63.5 in | Wt 151.2 lb

## 2020-03-17 DIAGNOSIS — R1011 Right upper quadrant pain: Secondary | ICD-10-CM | POA: Diagnosis not present

## 2020-03-17 DIAGNOSIS — K625 Hemorrhage of anus and rectum: Secondary | ICD-10-CM | POA: Diagnosis not present

## 2020-03-17 DIAGNOSIS — R194 Change in bowel habit: Secondary | ICD-10-CM | POA: Diagnosis not present

## 2020-03-17 NOTE — Patient Instructions (Signed)
If you are age 30 or older, your body mass index should be between 23-30. Your Body mass index is 26.37 kg/m. If this is out of the aforementioned range listed, please consider follow up with your Primary Care Provider.  If you are age 59 or younger, your body mass index should be between 19-25. Your Body mass index is 26.37 kg/m. If this is out of the aformentioned range listed, please consider follow up with your Primary Care Provider.   It has been recommended to you by your physician that you have a(n) endoscopy/colonoscopy completed. Per your request, we did not schedule the procedure(s) today.

## 2020-03-17 NOTE — Progress Notes (Signed)
Chief Complaint: Right upper quadrant pain, bloating, rectal bleeding  HPI:    Kathryn Allen is a 29 year old African-American female with past medical history as listed below, who was referred to me by Sharyne Peach, MD for a complaint of right upper quadrant pain and bloating, rectal bleeding.      09/12/2017 abdominal ultrasound with possible right nephrolithiasis, otherwise normal.    01/21/2018 CT renal stone study done for right upper quadrant pain and heartburn with nonobstructing right renal calculi and otherwise negative.    02/02/2020 CBC, CMP and lipid panel normal.    Today, the patient presents clinic and explains that she started with right upper quadrant pain in 2019 after completing a 21-day fast with her church.  Tells me the next day was her boyfriend's birthday and she ate everything she used to including steak, ice cream, shrimp, "I went all out", her menstrual cycle also started and she had a very acute severe 10/10 right upper quadrant pain that was very tight and felt like trapped gas, radiated up into her right shoulder and into her neck, she tried everything to release the gas like jumping jacks etc. and it gradually got slightly better but lingered and when her cycle went away it went away completely.  She had a thorough evaluation at the time including ultrasound and CT as above but nothing was found.  Since then it has come back every time that she has her menstrual cycle.  She has noted that if she does not eat fried foods, dairy or anything heavy during the week of her menstrual cycle then she can typically live through the discomfort which is only a 2-3/10 and pretty constant throughout that time.  She was put on Protonix initially but only took this as needed and it did not help.  Denies any overt heartburn or reflux symptoms.  She is currently on oral birth control pills.    Also describes a history of radiating from normal stools to constipation and does see rectal  bleeding at times.  A month ago her rectal bleeding was almost every day but she has since restarted her MiraLAX and stools are slightly more normal and she has seen no blood in the past 2 weeks.  Continues with rectal discomfort and some pressure. Tells me she had a colonoscopy about 6 or 7 years ago and was told this bleeding was from hemorrhoids, but she wants to make sure that is the case.    Recently married over the past couple of years and is thinking about stopping her birth control in the next 6 months to try and have a baby.  Works as a English as a second language teacher.    Denies fever, chills or weight loss.     Past Medical History:  Diagnosis Date  . Anxiety   . GERD (gastroesophageal reflux disease)     No past surgical history on file.  Current Outpatient Medications  Medication Sig Dispense Refill  . cetirizine (ZYRTEC) 10 MG tablet     . EPINEPHrine 0.3 mg/0.3 mL IJ SOAJ injection USE AS DIRECTED    . ibuprofen (ADVIL) 800 MG tablet Take 1 tablet (800 mg total) by mouth every 8 (eight) hours as needed. 30 tablet 1  . Norethindrone Acetate-Ethinyl Estradiol (LOESTRIN 1.5/30, 21,) 1.5-30 MG-MCG tablet Take 1 tablet by mouth daily. 3 Package 3   No current facility-administered medications for this visit.    Allergies as of 03/17/2020 - Review Complete 11/04/2019  Allergen Reaction Noted  .  Shellfish allergy Hives 08/27/2018    Family History  Problem Relation Age of Onset  . Breast cancer Maternal Grandmother   . Bladder Cancer Neg Hx   . Kidney cancer Neg Hx     Social History   Socioeconomic History  . Marital status: Married    Spouse name: Not on file  . Number of children: Not on file  . Years of education: Not on file  . Highest education level: Not on file  Occupational History  . Not on file  Tobacco Use  . Smoking status: Never Smoker  . Smokeless tobacco: Never Used  Vaping Use  . Vaping Use: Never used  Substance and Sexual Activity  . Alcohol use: Never  . Drug  use: Never  . Sexual activity: Yes    Birth control/protection: Pill  Other Topics Concern  . Not on file  Social History Narrative  . Not on file   Social Determinants of Health   Financial Resource Strain:   . Difficulty of Paying Living Expenses: Not on file  Food Insecurity:   . Worried About Charity fundraiser in the Last Year: Not on file  . Ran Out of Food in the Last Year: Not on file  Transportation Needs:   . Lack of Transportation (Medical): Not on file  . Lack of Transportation (Non-Medical): Not on file  Physical Activity:   . Days of Exercise per Week: Not on file  . Minutes of Exercise per Session: Not on file  Stress:   . Feeling of Stress : Not on file  Social Connections:   . Frequency of Communication with Friends and Family: Not on file  . Frequency of Social Gatherings with Friends and Family: Not on file  . Attends Religious Services: Not on file  . Active Member of Clubs or Organizations: Not on file  . Attends Archivist Meetings: Not on file  . Marital Status: Not on file  Intimate Partner Violence:   . Fear of Current or Ex-Partner: Not on file  . Emotionally Abused: Not on file  . Physically Abused: Not on file  . Sexually Abused: Not on file    Review of Systems:    Constitutional: No weight loss, fever or chills Skin: No rash  Cardiovascular: No chest pain Respiratory: No SOB  Gastrointestinal: See HPI and otherwise negative Genitourinary: No dysuria  Neurological: No headache, dizziness or syncope Musculoskeletal: No new muscle or joint pain Hematologic: No bruising Psychiatric: No history of depression or anxiety   Physical Exam:  Vital signs: BP 122/80 (BP Location: Left Arm, Patient Position: Sitting, Cuff Size: Normal)   Pulse 72   Ht 5' 3.5" (1.613 m) Comment: height measured without shoes  Wt 151 lb 4 oz (68.6 kg)   LMP 03/13/2020   BMI 26.37 kg/m   Constitutional:   Pleasant AA female appears to be in NAD, Well  developed, Well nourished, alert and cooperative Head:  Normocephalic and atraumatic. Eyes:   PEERL, EOMI. No icterus. Conjunctiva pink. Ears:  Normal auditory acuity. Neck:  Supple Throat: Oral cavity and pharynx without inflammation, swelling or lesion.  Respiratory: Respirations even and unlabored. Lungs clear to auscultation bilaterally.   No wheezes, crackles, or rhonchi.  Cardiovascular: Normal S1, S2. No MRG. Regular rate and rhythm. No peripheral edema, cyanosis or pallor.  Gastrointestinal:  Soft, nondistended, nontender. No rebound or guarding. Normal bowel sounds. No appreciable masses or hepatomegaly. Rectal:  External: hemorrhoids tag; Internal: some fullness, no  mass or frank blood, no ttp, tight sphincter tone Msk:  Symmetrical without gross deformities. Without edema, no deformity or joint abnormality.  Neurologic:  Alert and  oriented x4;  grossly normal neurologically.  Skin:   Dry and intact without significant lesions or rashes. Psychiatric: Demonstrates good judgement and reason without abnormal affect or behaviors.  See HPI for recent labs.  Assessment: 1.  Right upper quadrant pain: Seems to only be around her menstrual cycle and worse with certain foods at that time; consider gastritis versus gallbladder etiology versus endometriosis versus IBS? 2.  Change in bowel habits: Towards constipation at times, better with MiraLAX 3.  Rectal bleeding: Typically with above, previous colonoscopy 6 to 7 years ago with internal hemorrhoids and otherwise normal; most likely hemorrhoids  Plan: 1.  Patient would really like to proceed with EGD and colonoscopy for further evaluation.  Scheduled her for diagnostic procedures in the [place] with Dr. Tarri Glenn (patient requests a female physician).  Patient would like to wait until December she has a lot going on in the next couple of months.  Did discuss risks, benefits, limitations and alternatives and the patient agrees to proceed.  She has  had both of her Covid vaccines. 2.  Discussed the pain, it is only around her menstrual cycle and seems to be pretty well controlled with a change in diet away from fried/dairy/heavy foods.  Explained that this could be gallbladder in origin versus gastritis versus endometriosis.  Though symptoms have still not been controlled on oral contraceptive pills she does only take this for 3 weeks and takes a 1 week break.  It would be interesting to see if this pain continues if she does become pregnant. 3.  Also explained that if EGD and colonoscopy are normal then HIDA scan with CCK would likely be the last step in evaluation of GI etiology. 4.  Encouraged the patient to try and use over-the-counter Omeprazole about 3 to 4 days prior to her period and use it continually for the next 14 days to see if this helps at all. 5.  Patient to follow in clinic as needed after time procedures.  Ellouise Newer, PA-C [facility] 03/17/2020, 8:32 AM  Cc: Sharyne Peach, MD

## 2020-03-19 NOTE — Progress Notes (Signed)
Reviewed and agree with management plans. ? ? L. , MD, MPH  ?

## 2020-03-22 ENCOUNTER — Encounter: Payer: Self-pay | Admitting: Gastroenterology

## 2020-04-04 ENCOUNTER — Ambulatory Visit (AMBULATORY_SURGERY_CENTER): Payer: Self-pay | Admitting: *Deleted

## 2020-04-04 ENCOUNTER — Encounter: Payer: Self-pay | Admitting: Gastroenterology

## 2020-04-04 ENCOUNTER — Other Ambulatory Visit: Payer: Self-pay

## 2020-04-04 VITALS — Ht 63.5 in | Wt 152.0 lb

## 2020-04-04 DIAGNOSIS — R1011 Right upper quadrant pain: Secondary | ICD-10-CM

## 2020-04-04 DIAGNOSIS — K625 Hemorrhage of anus and rectum: Secondary | ICD-10-CM

## 2020-04-04 DIAGNOSIS — R194 Change in bowel habit: Secondary | ICD-10-CM

## 2020-04-04 MED ORDER — SUTAB 1479-225-188 MG PO TABS: 24.0000 | ORAL_TABLET | ORAL | 0 refills | Status: DC

## 2020-04-04 NOTE — Progress Notes (Signed)
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 Please only bring one care partner. Please be aware that your care partner may wait in the car in the parking lot or if they feel like they will be too hot to wait in the car, they may wait in the lobby on the 4th floor. All care partners are required to wear a mask the entire time (we do not have any that we can provide them), they need to practice social distancing, and we will do a Covid check for all patient's and care partners when you arrive. Also we will check their temperature and your temperature. If the care partner waits in their car they need to stay in the parking lot the entire time and we will call them on their cell phone when the patient is ready for discharge so they can bring the car to the front of the building. Also all patient's will need to wear a mask into building.

## 2020-05-11 ENCOUNTER — Other Ambulatory Visit: Payer: Self-pay

## 2020-05-11 ENCOUNTER — Encounter: Payer: Self-pay | Admitting: Gastroenterology

## 2020-05-11 ENCOUNTER — Ambulatory Visit (AMBULATORY_SURGERY_CENTER): Payer: Managed Care, Other (non HMO) | Admitting: Gastroenterology

## 2020-05-11 VITALS — BP 132/90 | HR 83 | Temp 96.9°F | Resp 24 | Ht 63.0 in | Wt 152.0 lb

## 2020-05-11 DIAGNOSIS — K319 Disease of stomach and duodenum, unspecified: Secondary | ICD-10-CM | POA: Diagnosis not present

## 2020-05-11 DIAGNOSIS — K625 Hemorrhage of anus and rectum: Secondary | ICD-10-CM | POA: Diagnosis not present

## 2020-05-11 DIAGNOSIS — R194 Change in bowel habit: Secondary | ICD-10-CM

## 2020-05-11 DIAGNOSIS — R1011 Right upper quadrant pain: Secondary | ICD-10-CM | POA: Diagnosis present

## 2020-05-11 DIAGNOSIS — K2970 Gastritis, unspecified, without bleeding: Secondary | ICD-10-CM | POA: Diagnosis not present

## 2020-05-11 DIAGNOSIS — K635 Polyp of colon: Secondary | ICD-10-CM | POA: Diagnosis not present

## 2020-05-11 DIAGNOSIS — D123 Benign neoplasm of transverse colon: Secondary | ICD-10-CM

## 2020-05-11 MED ORDER — SODIUM CHLORIDE 0.9 % IV SOLN: 500.0000 mL | Freq: Once | INTRAVENOUS | Status: DC

## 2020-05-11 NOTE — Progress Notes (Signed)
Report to PACU, RN, vss, BBS= Clear.  

## 2020-05-11 NOTE — Progress Notes (Signed)
Called to room to assist during endoscopic procedure.  Patient ID and intended procedure confirmed with present staff. Received instructions for my participation in the procedure from the performing physician.  

## 2020-05-11 NOTE — Op Note (Addendum)
[facility] Patient Name: Kathryn Allen Procedure Date: 05/11/2020 2:45 PM MRN: [id number] Endoscopist: Thornton Park MD, MD Age: 29 Referring MD:  Date of Birth: 05/05/1991 Gender: Female Account #: [id number] Procedure:                Upper GI endoscopy Indications:              Abdominal pain in the right upper quadrant Medicines:                Monitored Anesthesia Care Procedure:                Pre-Anesthesia Assessment:                           - Prior to the procedure, a History and Physical                            was performed, and patient medications and                            allergies were reviewed. The patient's tolerance of                            previous anesthesia was also reviewed. The risks                            and benefits of the procedure and the sedation                            options and risks were discussed with the patient.                            All questions were answered, and informed consent                            was obtained. Prior Anticoagulants: The patient has                            taken no previous anticoagulant or antiplatelet                            agents. ASA Grade Assessment: II - A patient with                            mild systemic disease. After reviewing the risks                            and benefits, the patient was deemed in                            satisfactory condition to undergo the procedure.                           After obtaining informed consent, the endoscope was  passed under direct vision. Throughout the                            procedure, the patient's blood pressure, pulse, and                            oxygen saturations were monitored continuously. The                            Endoscope was introduced through the mouth, and                            advanced to the second part of duodenum. The upper                            GI  endoscopy was accomplished without difficulty.                            The patient tolerated the procedure well. Scope In: Scope Out: Findings:                 The examined esophagus was normal. Biopsies were                            obtained from the proximal and distal esophagus                            with cold forceps for histology.                           Patchy minimal inflammation characterized by                            erythema was found in the gastric body. Biopsies                            were taken from the antrum, body, and fundus with a                            cold forceps for histology. Estimated blood loss                            was minimal.                           The examined duodenum was normal. Biopsies were                            taken with a cold forceps for histology. Estimated                            blood loss was minimal.                           The cardia and gastric fundus  were normal on                            retroflexion.                           The exam was otherwise without abnormality. Complications:            No immediate complications. Estimated blood loss:                            Minimal. Estimated Blood Loss:     Estimated blood loss: none. Impression:               - Normal esophagus. Biopsied.                           - Gastritis. Biopsied.                           - Normal examined duodenum. Biopsied.                           - The examination was otherwise normal. Recommendation:           - Patient has a contact number available for                            emergencies. The signs and symptoms of potential                            delayed complications were discussed with the                            patient. Return to normal activities tomorrow.                            Written discharge instructions were provided to the                            patient.                           - Resume  previous diet.                           - Continue present medications.                           - Avoid all NSAIDs.                           - Await pathology results. Thornton Park MD, MD 05/11/2020 3:34:50 PM This report has been signed electronically.

## 2020-05-11 NOTE — Patient Instructions (Signed)
HANDOUTS PROVIDED ON: GASTRITIS, POLYPS, & HEMORRHOIDS  The polyps removed/biopsies taken today have been sent for pathology.  The results can take 1-3 weeks to receive.  When your next colonoscopy should occur will be based on the pathology results.    You may resume your previous diet and medication schedule.  Thank you for allowing [place] to care for you today!!!   YOU HAD AN ENDOSCOPIC PROCEDURE TODAY AT [facility]:   Refer to the procedure report that was given to you for any specific questions about what was found during the examination.  If the procedure report does not answer your questions, please call your gastroenterologist to clarify.  If you requested that your care partner not be given the details of your procedure findings, then the procedure report has been included in a sealed envelope for you to review at your convenience later.  YOU SHOULD EXPECT: Some feelings of bloating in the abdomen. Passage of more gas than usual.  Walking can help get rid of the air that was put into your GI tract during the procedure and reduce the bloating. If you had a lower endoscopy (such as a colonoscopy or flexible sigmoidoscopy) you may notice spotting of blood in your stool or on the toilet paper. If you underwent a bowel prep for your procedure, you may not have a normal bowel movement for a few days.  Please Note:  You might notice some irritation and congestion in your nose or some drainage.  This is from the oxygen used during your procedure.  There is no need for concern and it should clear up in a day or so.  SYMPTOMS TO REPORT IMMEDIATELY:   Following lower endoscopy (colonoscopy or flexible sigmoidoscopy):  Excessive amounts of blood in the stool  Significant tenderness or worsening of abdominal pains  Swelling of the abdomen that is new, acute  Fever of 100F or higher   Following upper endoscopy (EGD)  Vomiting of blood or coffee ground material  New chest pain or  pain under the shoulder blades  Painful or persistently difficult swallowing  New shortness of breath  Fever of 100F or higher  Black, tarry-looking stools  For urgent or emergent issues, a gastroenterologist can be reached at any hour by calling [phone]. Do not use MyChart messaging for urgent concerns.    DIET:  We do recommend a small meal at first, but then you may proceed to your regular diet.  Drink plenty of fluids but you should avoid alcoholic beverages for 24 hours.  ACTIVITY:  You should plan to take it easy for the rest of today and you should NOT DRIVE or use heavy machinery until tomorrow (because of the sedation medicines used during the test).    FOLLOW UP: Our staff will call the number listed on your records Monday morning between 7:15 am and 8:15 am to check on you and address any questions or concerns that you may have regarding the information given to you following your procedure. If we do not reach you, we will leave a message.  We will attempt to reach you two times.  During this call, we will ask if you have developed any symptoms of COVID 19. If you develop any symptoms (ie: fever, flu-like symptoms, shortness of breath, cough etc.) before then, please call [phone].  If you test positive for Covid 19 in the 2 weeks post procedure, please call and report this information to [place].    If any  biopsies were taken you will be contacted by phone or by letter within the next 1-3 weeks.  Please call us at [phone] if you have not heard about the biopsies in 3 weeks.    SIGNATURES/CONFIDENTIALITY: You and/or your care partner have signed paperwork which will be entered into your electronic medical record.  These signatures attest to the fact that that the information above on your After Visit Summary has been reviewed and is understood.  Full responsibility of the confidentiality of this discharge information lies with you and/or your care-partner.

## 2020-05-11 NOTE — Op Note (Signed)
[facility] Patient Name: Kathryn Allen Procedure Date: 05/11/2020 2:45 PM MRN: [id number] Endoscopist: Thornton Park MD, MD Age: 29 Referring MD:  Date of Birth: 1990/08/15 Gender: Female Account #: [id number] Procedure:                Colonoscopy Indications:              Abdominal pain in the right upper quadrant, Rectal                            bleeding, Change in bowel habits Medicines:                Monitored Anesthesia Care Procedure:                Pre-Anesthesia Assessment:                           - Prior to the procedure, a History and Physical                            was performed, and patient medications and                            allergies were reviewed. The patient's tolerance of                            previous anesthesia was also reviewed. The risks                            and benefits of the procedure and the sedation                            options and risks were discussed with the patient.                            All questions were answered, and informed consent                            was obtained. Prior Anticoagulants: The patient has                            taken no previous anticoagulant or antiplatelet                            agents. ASA Grade Assessment: II - A patient with                            mild systemic disease. After reviewing the risks                            and benefits, the patient was deemed in                            satisfactory condition to undergo the procedure.  After obtaining informed consent, the colonoscope                            was passed under direct vision. Throughout the                            procedure, the patient's blood pressure, pulse, and                            oxygen saturations were monitored continuously. The                            Colonoscope was introduced through the anus and                            advanced to the 3 cm  into the ileum. The                            colonoscopy was unusually difficult due to                            restricted mobility of the colon. Successful                            completion of the procedure was aided by                            withdrawing the scope and replacing with the                            pediatric endoscope and applying abdominal                            pressure. The patient tolerated the procedure well.                            The quality of the bowel preparation was good. The                            terminal ileum, ileocecal valve, appendiceal                            orifice, and rectum were photographed. Scope In: 3:14:48 PM Scope Out: 3:26:29 PM Scope Withdrawal Time: 0 hours 8 minutes 42 seconds  Total Procedure Duration: 0 hours 11 minutes 41 seconds  Findings:                 Hemorrhoids were found on perianal exam.                           There is restricted mobility in the sigmoid colon.                            Just distal to this area is a patch of mildly  erythematous mucosa was found in the sigmoid colon.                            This is of unclear clinical significance. Biopsies                            were taken with a cold forceps for histology.                            Estimated blood loss was minimal.                           Two sessile polyps were found in the splenic                            flexure. The polyps were 1-2 mm in size. These                            polyps were removed with a cold snare. Resection                            and retrieval were complete. Estimated blood loss                            was minimal.                           The exam was otherwise without abnormality on                            direct and retroflexion views. Complications:            No immediate complications. Estimated blood loss:                            Minimal. Estimated  Blood Loss:     Estimated blood loss was minimal. Impression:               - Hemorrhoids found on perianal exam.                           - Erythematous mucosa in the sigmoid colon of                            unclear clinical significance. Biopsied.                           - Two 1-2 mm polyps at the splenic flexure, removed                            with a cold snare. Resected and retrieved.                           - The examination was otherwise normal on direct  and retroflexion views. Recommendation:           - Patient has a contact number available for                            emergencies. The signs and symptoms of potential                            delayed complications were discussed with the                            patient. Return to normal activities tomorrow.                            Written discharge instructions were provided to the                            patient.                           - Resume previous diet.                           - Continue present medications.                           - Await pathology results.                           - Repeat colonoscopy date to be determined after                            pending pathology results are reviewed for                            surveillance.                           - Emerging evidence supports eating a diet of                            fruits, vegetables, grains, calcium, and yogurt                            while reducing red meat and alcohol may reduce the                            risk of colon cancer. Thornton Park MD, MD 05/11/2020 3:45:50 PM This report has been signed electronically.

## 2020-05-15 ENCOUNTER — Telehealth: Payer: Self-pay

## 2020-05-15 NOTE — Telephone Encounter (Signed)
  Follow up Call-  Call back number 05/11/2020  Post procedure Call Back phone  # [phone]  Permission to leave phone message Yes  Some recent data might be hidden     Patient questions:  Do you have a fever, pain , or abdominal swelling? No. Pain Score  0 *  Have you tolerated food without any problems? Yes.  Have you been able to return to your normal activities? Yes.    Do you have any questions about your discharge instructions: Diet   No. Medications  No. Follow up visit  No.  Do you have questions or concerns about your Care? No.   Pt does report still having abdominal tenderness.  RN explained that during the procedure abdominal pressure was applied to aid in the advancement of the scope and that tenderness should progressively get better over the next couple of days.  Pt verbalized understanding and has number to call if symptoms worsen.  Actions: * If pain score is 4 or above: No action needed, pain <4.  1. Have you developed a fever since your procedure? no  2.   Have you had an respiratory symptoms (SOB or cough) since your procedure? no  3.   Have you tested positive for COVID 19 since your procedure no  4.   Have you had any family members/close contacts diagnosed with the COVID 19 since your procedure?  no   If yes to any of these questions please route to Joylene John, RN and Joella Prince, RN

## 2020-05-22 ENCOUNTER — Telehealth: Payer: Self-pay | Admitting: Gastroenterology

## 2020-05-22 ENCOUNTER — Other Ambulatory Visit: Payer: Self-pay

## 2020-05-22 MED ORDER — PANTOPRAZOLE SODIUM 40 MG PO TBEC: 40.0000 mg | DELAYED_RELEASE_TABLET | Freq: Every day | ORAL | 3 refills | Status: DC

## 2020-05-22 NOTE — Telephone Encounter (Signed)
See alternate note dated 11/29

## 2020-05-22 NOTE — Telephone Encounter (Signed)
Path results resulted on Wednesday prior to Thanksgiving when I was on vacation. Back in the office today. I will review her results. Thanks.

## 2020-05-22 NOTE — Telephone Encounter (Signed)
Patient is calling for pathology results.  Please review and advise

## 2020-05-22 NOTE — Telephone Encounter (Signed)
Patient left message with answering service requesting pathology results.

## 2020-06-06 ENCOUNTER — Telehealth: Payer: Self-pay | Admitting: Gastroenterology

## 2020-06-06 ENCOUNTER — Telehealth: Payer: Self-pay

## 2020-06-06 NOTE — Telephone Encounter (Signed)
She can take extra strength tylenol, but not with the pampprin because there is tylenol in that.  She can ask her primary if they are okay with her taking a combination NSAID with an H2 blocker Please set her up for a visit.

## 2020-06-06 NOTE — Telephone Encounter (Signed)
Call to patient. Patient states that her period started yesterday and she can no longer take NSAIDS for the cramping due to gastritis. Asking if there are any medications Dr. Talbert Nan recommends she can take? States she has been taking Pamprin since cycle started, but that does not seem to alleviate the pain. Rates cramping as 7-8/10. Has tried a heating pad, but does not help. States when she could take ibuprofen that alleviated her cramping, but the tylenol in Pamprin is not helping. Last took Pamprin ~30 minutes ago. OCP for contraception, although, patient states she is considering stopping because they don't seem to be doing anything. RN advised would review with Dr. Talbert Nan and return call with additional recommendations. Patient agreeable.   Routing to provider for review.

## 2020-06-06 NOTE — Telephone Encounter (Signed)
Pt has been told to avoid all NSAIDS due to gastritis. Pt has taken pamprin and tylenol but still having pain from cramping. Discussed with pt that if the tylenol and pamprin are not helping she should contact her GYN. Pt verbalized understanding.

## 2020-06-06 NOTE — Telephone Encounter (Signed)
Patient is calling in regards to being on her cycle and would like suggestions to alternate medications she can take. Patient states her gastroenterologist states she cannot take anti-inflammatory medications.

## 2020-06-06 NOTE — Telephone Encounter (Signed)
Inbound call from patient stating she is currently on her cycle and has taken Tylenol and Pamprin medication but they are not helping. Was advised to not take any NSAIDs.  Wants to know what else can she take.  Please advise.

## 2020-06-07 NOTE — Telephone Encounter (Signed)
Spoke with Kathryn Allen. Kathryn Allen given update and recommendations per Dr Talbert Nan. Kathryn Allen states will contact Dr Tarri Glenn to check on Protonix and taking either Tylenol or Pamprin together. Kathryn Allen verbalized understanding and is agreeable to OV. Kathryn Allen scheduled with Claiborne Billings, NP on 12/17 at 9 am per Kathryn Allen's request of date and time.  Routing to [place], NP for review.  Encounter closed

## 2020-06-08 NOTE — Progress Notes (Signed)
GYNECOLOGY  VISIT  CC:  Dysmenorrhea  HPI: 29 y.o. G0P0000 Married Black or [place] American female here for pain with cycle.  Period Duration (Days): 4 Period Pattern: Regular Menstrual Flow: Moderate Menstrual Control: Maxi pad Dysmenorrhea: (!) Severe Dysmenorrhea Symptoms: Cramping,Diarrhea (diarrhea & constipation occ)  First 2 days periods are very uncomfortable. Has to call in from work sometimes. Can no longer take NSAIDS r/t recent diagnosis of gastritis. Pamprin and Tylenol are ineffective. Planning on trying to get pregnant in 2022  GYNECOLOGIC HISTORY: Patient's last menstrual period was 06/05/2020 (exact date). Contraception: ocp Menopausal hormone therapy: none  Patient Active Problem List   Diagnosis Date Noted  . Gastroesophageal reflux disease 09/05/2017  . Allergic rhinitis 12/17/2012    Past Medical History:  Diagnosis Date  . Allergies   . Allergy   . Anxiety   . Gastritis   . GERD (gastroesophageal reflux disease)     Past Surgical History:  Procedure Laterality Date  . COLONOSCOPY     age 38    MEDS:   Current Outpatient Medications on File Prior to Visit  Medication Sig Dispense Refill  . cetirizine (ZYRTEC) 10 MG tablet Take 10 mg by mouth daily.     Marland Kitchen MAGNESIUM PO Take by mouth.    . Norethindrone Acetate-Ethinyl Estradiol (LOESTRIN 1.5/30, 21,) 1.5-30 MG-MCG tablet Take 1 tablet by mouth daily. 3 Package 3  . pantoprazole (PROTONIX) 40 MG tablet Take 1 tablet (40 mg total) by mouth daily. 30 tablet 3  . Probiotic Product (PROBIOTIC DAILY PO) Take by mouth.    . Pyridoxine HCl (VITAMIN B-6 PO) Take by mouth.    . EPINEPHrine 0.3 mg/0.3 mL IJ SOAJ injection USE AS DIRECTED (Patient not taking: No sig reported)     No current facility-administered medications on file prior to visit.    ALLERGIES: Shellfish allergy  Family History  Problem Relation Age of Onset  . Breast cancer Maternal Grandmother   . Diabetes Maternal Grandmother    . Irritable bowel syndrome Mother   . Hyperlipidemia Mother   . Hypertension Father   . Stroke Paternal Grandmother   . COPD Paternal Grandfather   . Bladder Cancer Neg Hx   . Kidney cancer Neg Hx   . Colon cancer Neg Hx   . Colon polyps Neg Hx   . Esophageal cancer Neg Hx   . Rectal cancer Neg Hx   . Stomach cancer Neg Hx     Review of Systems  HENT: Negative.   Eyes: Negative.   Respiratory: Negative.   Cardiovascular: Negative.   Gastrointestinal: Negative.   Endocrine: Negative.   Genitourinary: Negative.        Pain with cycle  Musculoskeletal: Negative.   Skin: Negative.   Allergic/Immunologic: Negative.   Neurological: Negative.   Hematological: Negative.   Psychiatric/Behavioral: Negative.     PHYSICAL EXAMINATION:    BP 112/78   Pulse 68   Resp 16   Wt 148 lb (67.1 kg)   LMP 06/05/2020 (Exact Date)   BMI 26.22 kg/m     General appearance: alert, cooperative and appears stated age Abdomen: soft, non-tender; bowel sounds normal; no masses,  no organomegaly Lymph:  no inguinal LAD noted  Pelvic: External genitalia:  no lesions              Urethra:  normal appearing urethra with no masses, tenderness or lesions              Bartholins and Skenes: normal  Vagina: normal appearing vagina with normal color and discharge, no lesions, no bleeding noted              Cervix: no cervical motion tenderness and no lesions              Bimanual Exam:  Uterus:  normal size, contour, position, consistency, mobility, non-tender and retroverted              Adnexa: no mass, fullness, tenderness              Rectovaginal:  Confirms.              Anus:  normal sphincter tone, no lesions  Chaperone, Lovena Le, CMA, was present for exam.  Assessment: Dysmenorrhea Planning pregnancy soon Gastritis Retroverted uterus  Plan:   1. Pt will contact Dr. Tarri Glenn and ask if celebrex would be okay with recent dx of gastritis. 2. Pt planning on Pregnancy soon  and does not want any change in birth control method, will be going off OCP soon  3. Advised that retroverted uterus can sometimes make dysmenorrhea more problematic 4. Other considerations: ginger, tumeric, magnesium (as a powder)   5:00pm- pt called and stated that Dr. Tarri Glenn got back to her and advised her that she can take celebrex with her protonix to try.  Medications sent to Pharmacy:  Celebrex 200mg , take 1 po bid prn cramps #30, 2 RF

## 2020-06-09 ENCOUNTER — Telehealth: Payer: Self-pay | Admitting: Physician Assistant

## 2020-06-09 ENCOUNTER — Other Ambulatory Visit: Payer: Self-pay

## 2020-06-09 ENCOUNTER — Ambulatory Visit: Payer: Managed Care, Other (non HMO) | Admitting: Nurse Practitioner

## 2020-06-09 ENCOUNTER — Telehealth: Payer: Self-pay

## 2020-06-09 ENCOUNTER — Encounter: Payer: Self-pay | Admitting: Nurse Practitioner

## 2020-06-09 VITALS — BP 112/78 | HR 68 | Resp 16 | Wt 148.0 lb

## 2020-06-09 DIAGNOSIS — N946 Dysmenorrhea, unspecified: Secondary | ICD-10-CM | POA: Diagnosis not present

## 2020-06-09 MED ORDER — CELECOXIB 200 MG PO CAPS: 200.0000 mg | ORAL_CAPSULE | Freq: Two times a day (BID) | ORAL | 2 refills | Status: DC

## 2020-06-09 NOTE — Telephone Encounter (Signed)
Routing to [place], NP for review , please advise   See phone encounter from GI on 06/09/20.

## 2020-06-09 NOTE — Patient Instructions (Addendum)
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 You will have pain in your lower belly (abdomen). The pain is caused by the tightening (contracting) of the muscles of the womb (uterus). The pain may be mild or very bad. With this condition, you may:  Have a headache.  Feel sick to your stomach (nauseous).  Throw up (vomit).  Have lower back pain. Follow these instructions at home: Helping pain and cramping   Put heat on your lower back or belly when you have pain or cramps. Use the heat source that your doctor tells you to use. ? Place a towel between your skin and the heat. ? Leave the heat on for 20-30 minutes. ? Remove the heat if your skin turns bright red. This is especially important if you cannot feel pain, heat, or cold. ? Do not have a heating pad on during sleep.  Do aerobic exercises. These include walking, swimming, or biking. These may help with cramps.  Massage your lower back or belly. This may help lessen pain. General instructions  Take over-the-counter and prescription medicines only as told by your doctor.  Do not drive or use heavy machinery while taking prescription pain medicine.  Avoid alcohol and caffeine during and right before your period. These can make cramps worse.  Do not use any products that have nicotine or tobacco. These include cigarettes and e-cigarettes. If you need help quitting, ask your doctor.  Keep all follow-up visits as told by your doctor. This is important. Contact a doctor if:  You have pain that gets worse.  You have pain that does not get better with medicine.  You have pain during sex.  You  feel sick to your stomach or you throw up during your period, and medicine does not help. Get help right away if:  You pass out (faint). Summary  Dysmenorrhea means painful cramps during your period (menstrual period).  Put heat on your lower back or belly when you have pain or cramps.  Do exercises like walking, swimming, or biking to help with cramps.  Contact a doctor if you have pain during sex. This information is not intended to replace advice given to you by your health care provider. Make sure you discuss any questions you have with your health care provider. Document Revised: 05/23/2017 Document Reviewed: 06/27/2016 Elsevier Patient Education  [employer].

## 2020-06-09 NOTE — Telephone Encounter (Signed)
Patient returned call. She can take Celebrex.

## 2020-06-09 NOTE — Telephone Encounter (Signed)
Patient states Celebrex will only be a couple days monthly during her cycle. Informed patient to make sure she is taking her Pantoprazole any time she takes her Celebrex. Patient states she is taking the Pantoprazole daily.

## 2020-06-09 NOTE — Telephone Encounter (Signed)
Patient is calling in regards to "see if our office will call her GI in reference to the medication Karma Ganja suggest she get the GI office to prescribe for her".

## 2020-06-09 NOTE — Telephone Encounter (Signed)
Please advise 

## 2020-06-09 NOTE — Telephone Encounter (Signed)
Celebrex can sometimes irritate the stomach.  If she is going to start this medication chronically would recommend that she take omeprazole 20 mg with it.  Thanks-JLL

## 2020-06-12 NOTE — Telephone Encounter (Signed)
Call to patient. Patient advised prescription for Celebrex sent to pharmacy on file on Friday 06-09-20. Patient verbalized understanding and states she will go today to pick up. Appreciative of phone call.   Routing to provider and will close encounter.

## 2020-06-22 ENCOUNTER — Telehealth: Payer: Self-pay | Admitting: Gastroenterology

## 2020-06-22 NOTE — Telephone Encounter (Signed)
I called Gissela and told her to call the insurance company and see what they cover in this class of drugs and call us back. Then we will ask Dr Orvan Falconer which one she prefers. She is aware Dr Orvan Falconer is out this week.

## 2020-06-22 NOTE — Telephone Encounter (Signed)
Inbound call from patient stating Protonix medication is no longer covered under her insurance and is too expensive for her to pay out of pocket.  Is there an alternative we can send to pharmacy?  Please advise.

## 2020-07-03 ENCOUNTER — Encounter: Payer: Self-pay | Admitting: Obstetrics and Gynecology

## 2020-07-19 ENCOUNTER — Encounter: Payer: Self-pay | Admitting: Physician Assistant

## 2020-07-19 ENCOUNTER — Ambulatory Visit: Payer: Managed Care, Other (non HMO) | Admitting: Physician Assistant

## 2020-07-19 VITALS — BP 114/70 | HR 70 | Ht 63.0 in | Wt 148.0 lb

## 2020-07-19 DIAGNOSIS — K589 Irritable bowel syndrome without diarrhea: Secondary | ICD-10-CM | POA: Diagnosis not present

## 2020-07-19 DIAGNOSIS — R1011 Right upper quadrant pain: Secondary | ICD-10-CM

## 2020-07-19 DIAGNOSIS — Z86010 Personal history of colonic polyps: Secondary | ICD-10-CM

## 2020-07-19 NOTE — Progress Notes (Signed)
Chief Complaint: Follow-up right upper quadrant pain, bloating and rectal bleeding  HPI:    Kathryn Allen is a 30 year old African-American female with a past medical history as listed below, known to Dr. Tarri Glenn, who presents clinic today for follow-up of her right upper quadrant abdominal pain, bloating and rectal bleeding.    09/12/2017 abdominal ultrasound with possible right nephrolithiasis, otherwise normal.    01/21/2018 CT renal stone study done for right upper quadrant pain and heartburn with nonobstructing right renal calculi and otherwise negative.    02/02/2020 CBC, CMP and lipid panel normal    03/17/2020 office visit with me and at that time described right upper quadrant pain ever since 2019.  Tells me that she had right upper quadrant abdominal pain with her menstrual cycle.  Also describes radiating from normal stools to constipation with some rectal bleeding at times.    05/11/2020 EGD and colonoscopy.  EGD with gastritis and otherwise normal.  Pathology showed reactive gastropathy.  Patient was told to start pantoprazole 40 mg every morning x8 weeks and not use any NSAIDs.  Colonoscopy on the same day with hemorrhoids, erythematous mucosa in the sigmoid colon and 2 polyps.  Pathology showed sessile serrated polyps and repeat colonoscopy recommended in 5 years.    Today, the patient presents to clinic and tells me that she is doing fairly well.  She has tried to completely alter her diet including more fiber, no greasy foods and no red meats.  Tells me that she was on Pantoprazole 40 mg once daily for 30 days but then her insurance stopped covering it so she started Omeprazole 20 mg 2 tabs once daily.  She has been taking this for the past month.  Tells me that she continues with some right upper quadrant discomfort but only during her cycle.  This may be slightly decreased since she changed her diet.  Tells me she has discussed right upper quadrant pain with her OB/GYN who told her that  she has a retroverted uterus which may cause her some abdominal pain during times of her cycle instead of the typical "uterine cramps".    Also tells me that she if she uses MiraLAX every other day she has a smoother bowel movement which seems to help with her hemorrhoids.    Tells me that she was quite worried after findings of precancerous polyps at time of her colonoscopy, but has "calmed down".    Denies fever, chills, weight loss or blood in her stool.  Past Medical History:  Diagnosis Date  . Allergies   . Allergy   . Anxiety   . Gastritis   . GERD (gastroesophageal reflux disease)     Past Surgical History:  Procedure Laterality Date  . COLONOSCOPY     age 47    Current Outpatient Medications  Medication Sig Dispense Refill  . celecoxib (CELEBREX) 200 MG capsule Take 1 capsule (200 mg total) by mouth 2 (two) times daily. 30 capsule 2  . cetirizine (ZYRTEC) 10 MG tablet Take 10 mg by mouth daily.     Marland Kitchen EPINEPHrine 0.3 mg/0.3 mL IJ SOAJ injection USE AS DIRECTED (Patient not taking: No sig reported)    . MAGNESIUM PO Take by mouth.    . Norethindrone Acetate-Ethinyl Estradiol (LOESTRIN 1.5/30, 21,) 1.5-30 MG-MCG tablet Take 1 tablet by mouth daily. 3 Package 3  . pantoprazole (PROTONIX) 40 MG tablet Take 1 tablet (40 mg total) by mouth daily. 30 tablet 3  . Probiotic Product (PROBIOTIC  DAILY PO) Take by mouth.    . Pyridoxine HCl (VITAMIN B-6 PO) Take by mouth.     No current facility-administered medications for this visit.    Allergies as of 07/19/2020 - Review Complete 06/09/2020  Allergen Reaction Noted  . Shellfish allergy Hives 08/27/2018    Family History  Problem Relation Age of Onset  . Breast cancer Maternal Grandmother   . Diabetes Maternal Grandmother   . Irritable bowel syndrome Mother   . Hyperlipidemia Mother   . Hypertension Father   . Stroke Paternal Grandmother   . COPD Paternal Grandfather   . Bladder Cancer Neg Hx   . Kidney cancer Neg Hx   .  Colon cancer Neg Hx   . Colon polyps Neg Hx   . Esophageal cancer Neg Hx   . Rectal cancer Neg Hx   . Stomach cancer Neg Hx     Social History   Socioeconomic History  . Marital status: Married    Spouse name: Not on file  . Number of children: 0  . Years of education: Not on file  . Highest education level: Not on file  Occupational History  . Occupation: Administrator  Tobacco Use  . Smoking status: Never Smoker  . Smokeless tobacco: Never Used  Vaping Use  . Vaping Use: Never used  Substance and Sexual Activity  . Alcohol use: Never  . Drug use: Never  . Sexual activity: Yes    Partners: Male    Birth control/protection: OCP  Other Topics Concern  . Not on file  Social History Narrative  . Not on file   Social Determinants of Health   Financial Resource Strain: Not on file  Food Insecurity: Not on file  Transportation Needs: Not on file  Physical Activity: Not on file  Stress: Not on file  Social Connections: Not on file  Intimate Partner Violence: Not on file    Review of Systems:    Constitutional: No weight loss, fever or chills Cardiovascular: No chest pain Respiratory: No SOB Gastrointestinal: See HPI and otherwise negative   Physical Exam:  Vital signs: BP 114/70   Pulse 70   Ht 5\' 3"  (1.6 m)   Wt 148 lb (67.1 kg)   BMI 26.22 kg/m   Constitutional:   Pleasant AA female appears to be in NAD, Well developed, Well nourished, alert and cooperative Respiratory: Respirations even and unlabored. Lungs clear to auscultation bilaterally.   No wheezes, crackles, or rhonchi.  Cardiovascular: Normal S1, S2. No MRG. Regular rate and rhythm. No peripheral edema, cyanosis or pallor.  Gastrointestinal:  Soft, nondistended, nontender. No rebound or guarding. Normal bowel sounds. No appreciable masses or hepatomegaly. Rectal:  Not performed.  Psychiatric:  Demonstrates good judgement and reason without abnormal affect or behaviors.  NO recent labs or  imaging.  Assessment: 1.  Chronic right upper quadrant pain: Previous CT, ultrasound and labs normal, recent EGD with gastritis and colonoscopy with some precancerous polyps but no real cause for right upper quadrant pain seen, again pain is only at time of patient cycle; question endometriosis 2.  IBS: Discussed the patient likely has a component of this with some abdominal pain and alteration in bowel habits leaning towards constipation 3.  History of adenomatous polyp  Plan: 1.  Answered all the patient's questions today.  Reassured her that the fact that we found a precancerous polyp was a good thing, now we can keep a closer surveillance on her.  She will be due  for repeat colonoscopy in 5 years.  Tried to answer all her questions. 2.  Discussed with the patient that she can eat red meat in moderation, but would recommend she continue her high-fiber diet and increased water intake to help with stools 3.  Can continue MiraLAX every other day 4.  Patient requested referral to a dietitian to further discuss irritable bowel.  Sent referral. 5.  Patient can discontinue Omeprazole at this time as it has been 8 weeks. 6.  Patient to follow in clinic with myself or Dr. Tarri Glenn as needed in the future.  Kathryn Newer, PA-C [facility] 07/19/2020, 3:33 PM  Cc: Sharyne Peach, MD

## 2020-07-19 NOTE — Patient Instructions (Addendum)
If you are age 31 or older, your body mass index should be between 23-30. Your Body mass index is 26.22 kg/m. If this is out of the aforementioned range listed, please consider follow up with your Primary Care Provider.  If you are age 72 or younger, your body mass index should be between 19-25. Your Body mass index is 26.22 kg/m. If this is out of the aformentioned range listed, please consider follow up with your Primary Care Provider.   You can stop using Omeprazole.  You will be due for your next Colonoscopy in 2026.  We have put in a referral to a Dietician. You should be contacted within 2 weeks.  Follow up as needed.  Thank you for entrusting me with your care and choosing [facility].  Ellouise Newer, PA-C

## 2020-07-20 NOTE — Progress Notes (Signed)
Reviewed.   L. , MD, MPH  

## 2020-08-24 ENCOUNTER — Encounter: Payer: Self-pay | Admitting: Obstetrics and Gynecology

## 2020-10-03 ENCOUNTER — Telehealth: Payer: Self-pay

## 2020-10-03 NOTE — Telephone Encounter (Signed)
Recently stopped birth control pill. This first period was several weeks later than she thought but she did start last week with menses.  She wanted to know if that was normal. I did explain that after d/c oc's that body has to find it's natural hormonal rhythm again and menses can be delayed. I told her important to make sure she is not pregnant if unprotected intercourse but likely the delay was just because she just stopped taking the pill and a bit of hesitancy because of that.   She has appointment in May for annual exam. Will keep track of menses.

## 2020-10-26 NOTE — Progress Notes (Signed)
30 y.o. G0P0000 Married Black or [place] American Not Hispanic or Latino female here for annual exam. Preconception questions. Blood work from last year returned showing immunity to [place] and Varicella.  She stopped OCP's in 3/22. Husband just told her he isn't ready for her to get pregnant, but they are having unprotected intercourse. It is okay if she gets pregnant. She has had 2 cycles since going off the pill, the first was 35 days and the last was 26 days.  She noticed a vaginal odor on Monday morning, had sex Sunday night. No itching, burning, irritation or d/c.  Period Cycle (Days): 28 Period Duration (Days): 4-5 Period Pattern: Regular Menstrual Flow: Heavy,Light Menstrual Control: Maxi pad Dysmenorrhea: (!) Mild Dysmenorrhea Symptoms: Cramping  She can saturate a pad in up to 2 hours.   Last LMP 10/27/20.        Sexually active: Yes. The current method of family planning is none.  Exercising: No. Smoker: No.  Health Maintenance: Pap:  08/01/17 Neg History of abnormal Pap: No. Colonoscopy: nov/2021 due to GI issues TDaP:  05/11/14 Gardasil: completed   reports that she has never smoked. She has never used smokeless tobacco. She reports that she does not drink alcohol and does not use drugs. She works in Environmental health practitioner  Past Medical History:  Diagnosis Date  . Allergies   . Allergy   . Anxiety   . Gastritis   . GERD (gastroesophageal reflux disease)     Past Surgical History:  Procedure Laterality Date  . COLONOSCOPY     age 78    Current Outpatient Medications  Medication Sig Dispense Refill  . amoxicillin (AMOXIL) 250 MG capsule Take 250 mg by mouth 3 (three) times daily.    . cetirizine (ZYRTEC) 10 MG tablet Take 10 mg by mouth daily.     . Multiple Vitamin (MULTIVITAMIN) tablet Take 1 tablet by mouth daily.    . Probiotic Product (PROBIOTIC DAILY PO) Take by mouth.    . triamcinolone (NASACORT ALLERGY 24HR) 55 MCG/ACT AERO nasal inhaler Place 2 sprays  into the nose daily.     No current facility-administered medications for this visit.    Family History  Problem Relation Age of Onset  . Breast cancer Maternal Grandmother   . Diabetes Maternal Grandmother   . Irritable bowel syndrome Mother   . Hyperlipidemia Mother   . Hypertension Father   . Stroke Paternal Grandmother   . COPD Paternal Grandfather   . Bladder Cancer Neg Hx   . Kidney cancer Neg Hx   . Colon cancer Neg Hx   . Colon polyps Neg Hx   . Esophageal cancer Neg Hx   . Rectal cancer Neg Hx   . Stomach cancer Neg Hx     Review of Systems  Constitutional: Negative.   HENT: Negative.   Eyes: Negative.   Respiratory: Negative.   Cardiovascular: Negative.   Gastrointestinal: Negative.   Endocrine: Negative.   Genitourinary: Negative.   Musculoskeletal: Negative.   Skin: Negative.   Allergic/Immunologic: Negative.   Neurological: Negative.   Hematological: Negative.   Psychiatric/Behavioral: Negative.     Exam:   BP 118/78 (BP Location: Right Arm, Patient Position: Sitting, Cuff Size: Normal)   Pulse 70   Ht 5' 3.5" (1.613 m)   Wt 150 lb (68 kg)   LMP 10/27/2020   SpO2 99%   BMI 26.15 kg/m   Weight change: @WEIGHTCHANGE @ Height:   Height: 5' 3.5" (161.3 cm)  Ht Readings  from Last 3 Encounters:  11/08/20 5' 3.5" (1.613 m)  07/19/20 5\' 3"  (1.6 m)  05/11/20 5\' 3"  (1.6 m)    General appearance: alert, cooperative and appears stated age Head: Normocephalic, without obvious abnormality, atraumatic Neck: no adenopathy, supple, symmetrical, trachea midline and thyroid normal to inspection and palpation Lungs: clear to auscultation bilaterally Cardiovascular: regular rate and rhythm Breasts: normal appearance, no masses or tenderness Abdomen: soft, non-tender; non distended,  no masses,  no organomegaly Extremities: extremities normal, atraumatic, no cyanosis or edema Skin: Skin color, texture, turgor normal. No rashes or lesions Lymph nodes: Cervical,  supraclavicular, and axillary nodes normal. No abnormal inguinal nodes palpated Neurologic: Grossly normal   Pelvic: External genitalia:  no lesions              Urethra:  normal appearing urethra with no masses, tenderness or lesions              Bartholins and Skenes: normal                 Vagina: normal appearing vagina with normal color and discharge, no lesions              Cervix: no lesions               Bimanual Exam:  Uterus:  normal size, contour, position, consistency, mobility, non-tender and anteverted              Adnexa: no mass, fullness, tenderness               Rectovaginal: Confirms               Anus:  normal sphincter tone, no lesions  Thurnell Garbe chaperoned for the exam.   1. Well woman exam Discussed breast self exam Discussed calcium and vit D intake She is on PNV, information given on preconception health Labs with her primary  2. Screening for cervical cancer - Cytology - PAP with HPV  3. Vaginal odor - WET PREP FOR TRICH, YEAST, CLUE: negaive Call with persistent symptoms.

## 2020-11-06 ENCOUNTER — Encounter: Payer: Self-pay | Admitting: Obstetrics and Gynecology

## 2020-11-08 ENCOUNTER — Encounter: Payer: Self-pay | Admitting: Obstetrics and Gynecology

## 2020-11-08 ENCOUNTER — Other Ambulatory Visit: Payer: Self-pay

## 2020-11-08 ENCOUNTER — Other Ambulatory Visit (HOSPITAL_COMMUNITY)
Admission: RE | Admit: 2020-11-08 | Discharge: 2020-11-08 | Disposition: A | Discharge status: Home / Self Care | Payer: Managed Care, Other (non HMO) | Source: Ambulatory Visit | Attending: Obstetrics and Gynecology | Admitting: Obstetrics and Gynecology

## 2020-11-08 ENCOUNTER — Ambulatory Visit (INDEPENDENT_AMBULATORY_CARE_PROVIDER_SITE_OTHER): Payer: Managed Care, Other (non HMO) | Admitting: Obstetrics and Gynecology

## 2020-11-08 VITALS — BP 118/78 | HR 70 | Ht 63.5 in | Wt 150.0 lb

## 2020-11-08 DIAGNOSIS — Z124 Encounter for screening for malignant neoplasm of cervix: Secondary | ICD-10-CM | POA: Insufficient documentation

## 2020-11-08 DIAGNOSIS — Z01419 Encounter for gynecological examination (general) (routine) without abnormal findings: Secondary | ICD-10-CM | POA: Diagnosis not present

## 2020-11-08 DIAGNOSIS — N898 Other specified noninflammatory disorders of vagina: Secondary | ICD-10-CM | POA: Diagnosis not present

## 2020-11-08 LAB — WET PREP FOR TRICH, YEAST, CLUE

## 2020-11-08 NOTE — Patient Instructions (Signed)
EXERCISE   We recommended that you start or continue a regular exercise program for good health. Physical activity is anything that gets your body moving, some is better than none. The CDC recommends 150 minutes per week of Moderate-Intensity Aerobic Activity and 2 or more days of Muscle Strengthening Activity.  Benefits of exercise are limitless: helps weight loss/weight maintenance, improves mood and energy, helps with depression and anxiety, improves sleep, tones and strengthens muscles, improves balance, improves bone density, protects from chronic conditions such as heart disease, high blood pressure and diabetes and so much more. To learn more visit: https://www.cdc.gov/physicalactivity/index.html  DIET: Good nutrition starts with a healthy diet of fruits, vegetables, whole grains, and lean protein sources. Drink plenty of water for hydration. Minimize empty calories, sodium, sweets. For more information about dietary recommendations visit: https://health.gov/our-work/nutrition-physical-activity/dietary-guidelines and https://www.myplate.gov/  ALCOHOL:  Women should limit their alcohol intake to no more than 7 drinks/beers/glasses of wine (combined, not each!) per week. Moderation of alcohol intake to this level decreases your risk of breast cancer and liver damage.  If you are concerned that you may have a problem, or your friends have told you they are concerned about your drinking, there are many resources to help. A well-known program that is free, effective, and available to all people all over the nation is Alcoholics Anonymous.  Check out this site to learn more: https://www.aa.org/   CALCIUM AND VITAMIN D:  Adequate intake of calcium and Vitamin D are recommended for bone health.  You should be getting between 1000-1200 mg of calcium and 800 units of Vitamin D daily between diet and supplements  PAP SMEARS:  Pap smears, to check for cervical cancer or precancers,  have traditionally been  done yearly, scientific advances have shown that most women can have pap smears less often.  However, every woman still should have a physical exam from her gynecologist every year. It will include a breast check, inspection of the vulva and vagina to check for abnormal growths or skin changes, a visual exam of the cervix, and then an exam to evaluate the size and shape of the uterus and ovaries. We will also provide age appropriate advice regarding health maintenance, like when you should have certain vaccines, screening for sexually transmitted diseases, bone density testing, colonoscopy, mammograms, etc.   MAMMOGRAMS:  All women over 40 years old should have a routine mammogram.   COLON CANCER SCREENING: Now recommend starting at age 45. At this time colonoscopy is not covered for routine screening until 50. There are take home tests that can be done between 45-49.   COLONOSCOPY:  Colonoscopy to screen for colon cancer is recommended for all women at age 50.  We know, you hate the idea of the prep.  We agree, BUT, having colon cancer and not knowing it is worse!!  Colon cancer so often starts as a polyp that can be seen and removed at colonscopy, which can quite literally save your life!  And if your first colonoscopy is normal and you have no family history of colon cancer, most women don't have to have it again for 10 years.  Once every ten years, you can do something that may end up saving your life, right?  We will be happy to help you get it scheduled when you are ready.  Be sure to check your insurance coverage so you understand how much it will cost.  It may be covered as a preventative service at no cost, but you should check   your particular policy.      Breast Self-Awareness Breast self-awareness means being familiar with how your breasts look and feel. It involves checking your breasts regularly and reporting any changes to your health care provider. Practicing breast self-awareness is  important. A change in your breasts can be a sign of a serious medical problem. Being familiar with how your breasts look and feel allows you to find any problems early, when treatment is more likely to be successful. All women should practice breast self-awareness, including women who have had breast implants. How to do a breast self-exam One way to learn what is normal for your breasts and whether your breasts are changing is to do a breast self-exam. To do a breast self-exam: Look for Changes  1. Remove all the clothing above your waist. 2. Stand in front of a mirror in a room with good lighting. 3. Put your hands on your hips. 4. Push your hands firmly downward. 5. Compare your breasts in the mirror. Look for differences between them (asymmetry), such as: ? Differences in shape. ? Differences in size. ? Puckers, dips, and bumps in one breast and not the other. 6. Look at each breast for changes in your skin, such as: ? Redness. ? Scaly areas. 7. Look for changes in your nipples, such as: ? Discharge. ? Bleeding. ? Dimpling. ? Redness. ? A change in position. Feel for Changes Carefully feel your breasts for lumps and changes. It is best to do this while lying on your back on the floor and again while sitting or standing in the shower or tub with soapy water on your skin. Feel each breast in the following way:  Place the arm on the side of the breast you are examining above your head.  Feel your breast with the other hand.  Start in the nipple area and make  inch (2 cm) overlapping circles to feel your breast. Use the pads of your three middle fingers to do this. Apply light pressure, then medium pressure, then firm pressure. The light pressure will allow you to feel the tissue closest to the skin. The medium pressure will allow you to feel the tissue that is a little deeper. The firm pressure will allow you to feel the tissue close to the ribs.  Continue the overlapping circles,  moving downward over the breast until you feel your ribs below your breast.  Move one finger-width toward the center of the body. Continue to use the  inch (2 cm) overlapping circles to feel your breast as you move slowly up toward your collarbone.  Continue the up and down exam using all three pressures until you reach your armpit.  Write Down What You Find  Write down what is normal for each breast and any changes that you find. Keep a written record with breast changes or normal findings for each breast. By writing this information down, you do not need to depend only on memory for size, tenderness, or location. Write down where you are in your menstrual cycle, if you are still menstruating. If you are having trouble noticing differences in your breasts, do not get discouraged. With time you will become more familiar with the variations in your breasts and more comfortable with the exam. How often should I examine my breasts? Examine your breasts every month. If you are breastfeeding, the best time to examine your breasts is after a feeding or after using a breast pump. If you menstruate, the best time to   examine your breasts is 5-7 days after your period is over. During your period, your breasts are lumpier, and it may be more difficult to notice changes. When should I see my health care provider? See your health care provider if you notice:  A change in shape or size of your breasts or nipples.  A change in the skin of your breast or nipples, such as a reddened or scaly area.  Unusual discharge from your nipples.  A lump or thick area that was not there before.  Pain in your breasts.  Anything that concerns you.   Preparing for Pregnancy If you are planning to become pregnant, talk to your health care provider about preconception care. This type of care helps you prepare for a safe and healthy pregnancy. During this visit, your health care provider will:  Do a complete physical  exam, including a Pap test.  Take your complete medical history.  Give you information, answer your questions, and help you resolve problems. Preconception checklist Medical history  Tell your health care provider about any medical conditions you have or have had. Your pregnancy or your ability to become pregnant may be affected by long-term (chronic) conditions, such as: ? Diabetes. ? High blood pressure (hypertension). ? Thyroid problems.  Tell your health care provider about your family's medical history and your partner's medical history.  Tell your health care provider if you have or have had any sexually transmitted infections, orSTIs. These can affect your pregnancy. In some cases, they can be passed to your baby.  If needed, discuss the benefits of genetic testing. This test checks for conditions that may be passed from parent to child.  Tell your health care provider about: ? Any problems you had getting pregnant or while pregnant. ? Any medicines you take. These include vitamins, herbal supplements, and over-the-counter medicines. ? Your history of getting vaccines. Discuss any vaccines that you may need. Diet  Ask your health care provider about what foods to eat in order to get a balance of nutrients. This is especially important when you are pregnant or preparing to become pregnant. It is recommended that women of childbearing age take a folic acid supplement of 400 mcg daily and eat foods rich in folic acid to prevent certain birth defects.  Ask your health care provider to help you reach a healthy weight before pregnancy. ? If you are overweight, you may have a higher risk for certain problems. These include hypertension, diabetes, and early (preterm) birth. ? If you are underweight, you are more likely to have a baby who has a low birth weight. Lifestyle, work, and home Let your health care provider know about:  Any lifestyle habits that you have, such as use of  alcohol, drugs, or tobacco products.  Fun and leisure activities that may put you at risk during pregnancy, such as downhill skiing and certain exercise programs.  Any plans to travel out of the country, especially to places with an active [place] virus outbreak.  Harmful substances that you may be exposed to at work or at home. These include chemicals, pesticides, radiation, and substances from cat litter boxes.  Any concerns you have for your safety at home. Mental health Tell your health care provider about:  Any history of mental health conditions, including feelings of depression, sadness, or anxiety.  Any medicines that you take for a mental health condition. These include herbs and supplements. How do I know that I am pregnant? You may be pregnant  if you have been sexually active and you miss your period. Other symptoms of early pregnancy include:  Mild cramping.  Very light vaginal bleeding (spotting).  Feeling more tired than usual.  Nausea and vomiting. These may be signs of morning sickness. Take a home pregnancy test if you have any of these symptoms. This test checks for a hormone in your urine called human chorionic gonadotropin, or hCG. A woman's body begins to make this hormone during early pregnancy. These tests are very accurate. Wait until at least the first day after you miss your period to take a home pregnancy test. If the test shows that you are pregnant, call your health care provider for a prenatal care visit. What should I do if I become pregnant?  Schedule a visit with your health care provider as soon as you suspect you are pregnant.  Talk to your health care provider if you are taking prescription medicines to determine if they are safe to take during pregnancy.  You may continue to have sex if it does not cause pain or other problems, such as vaginal bleeding. Follow these instructions at home: Eating and drinking  Follow instructions from your health  care provider about eating or drinking restrictions.  Drink enough fluid to keep your urine pale yellow.  Eat a balanced diet. This includes fresh fruits and vegetables, whole grains, lean meats, low-fat dairy products, healthy fats, and foods that are high in fiber. Ask to meet with a nutritionist or registered dietitian for help with meal planning and goals.  Avoid eating raw or undercooked meat and seafood.  Avoid eating or drinking unpasteurized dairy products.   Lifestyle  Get regular exercise. Try to be active for at least 30 minutes a day on most days of the week. Ask your health care provider which activities are safe during pregnancy.  Maintain a healthy weight.  Avoid toxic fumes and chemicals.  Avoid cleaning cat litter boxes. Cat feces may contain a harmful parasite called toxoplasma.  Avoid travel to countries where [place] virus is common.  Do not use any products that contain nicotine or tobacco, such as cigarettes, e-cigarettes, and chewing tobacco. If you need help quitting, ask your health care provider.  Do not drink alcohol or use drugs.      General instructions  Keep an accurate record of your menstrual periods. This makes it easier for your health care provider to determine your baby's due date.  Take over-the-counter and prescription medicines only as told by your health care provider.  Begin taking prenatal vitamins and folic acid supplements daily as directed.  Manage any chronic conditions, such as hypertension and diabetes, as told by your health care provider. This is important. Summary  If you are planning to become pregnant, talk to your health care provider about preconception care. This is an important part of planning for a healthy pregnancy.  Women of childbearing age should take 102 mcg of folic acid daily in addition to eating a diet rich in folic acid. This will prevent certain birth defects.  Schedule a visit with your health care provider  as soon as you suspect you are pregnant. Tell your health care provider about your medical history, lifestyle activities, home safety, and other things that may concern you. This information is not intended to replace advice given to you by your health care provider. Make sure you discuss any questions you have with your health care provider. Document Revised: 03/10/2019 Document Reviewed: 03/10/2019 Elsevier Patient Education  [place].

## 2020-11-09 LAB — CYTOLOGY - PAP
Comment: NEGATIVE
Diagnosis: NEGATIVE
High risk HPV: NEGATIVE

## 2021-01-26 ENCOUNTER — Telehealth: Payer: Self-pay | Admitting: *Deleted

## 2021-01-26 ENCOUNTER — Encounter: Payer: Self-pay | Admitting: Obstetrics and Gynecology

## 2021-01-26 NOTE — Telephone Encounter (Signed)
Patient complains of pelvic pain that comes and goes, severity 3. Pain occurs 2 to 3 days after cycle. No other symptoms. See My chart message below:    Hello Dr. Talbert Nan, I had a question about some pain I've been experiencing. My period ended about 2 days ago and I'm still having some residual pelvic pain. I wouldn't describe them as cramps, just twinges of pain that come and go. Some are full but some are kind of sharp. It's nothing I ever take any meds for.  This is something I've noticed the past few periods since coming off the pill. I just wanted to know if this is something I should be concerned about or if this could be normal for me. I just had a pelvic exam with you a few months ago.   Thanks Kathryn Allen  I told patient I will rely message to Dr. Talbert Nan for advice/recommendations.

## 2021-01-30 NOTE — Telephone Encounter (Signed)
Pt aware of recommendations/advice

## 2021-03-19 ENCOUNTER — Other Ambulatory Visit: Payer: Self-pay

## 2021-03-19 ENCOUNTER — Ambulatory Visit (INDEPENDENT_AMBULATORY_CARE_PROVIDER_SITE_OTHER): Payer: Managed Care, Other (non HMO)

## 2021-03-19 DIAGNOSIS — Z3200 Encounter for pregnancy test, result unknown: Secondary | ICD-10-CM

## 2021-03-19 LAB — POCT PREGNANCY, URINE: Preg Test, Ur: POSITIVE — AB

## 2021-03-19 NOTE — Progress Notes (Addendum)
Possible Pregnancy  Here today for pregnancy confirmation. Patient has not been seen in our office prior to today, but was unable to wait to speak with a provider. Pt told she would be called with results. UPT in office today is positive. Called pt with results. Pt reports first positive home UPT on 03/13/21. Reviewed dating with patient:    LMP: 02/16/21 EDD: 11/23/21 4w 3d today  OB history reviewed. Reviewed medications and allergies with patient; list of medications safe to take during pregnancy given. Recommended pt continue prenatal vitamin and schedule prenatal care. Pt states she has been recommended to see Ilda Basset, MD and would like to schedule with him for her prenatal care. [place] office notified to schedule.  Annabell Howells, RN 03/19/2021  11:21 AM

## 2021-03-19 NOTE — Patient Instructions (Signed)

## 2021-03-22 ENCOUNTER — Telehealth: Payer: Self-pay | Admitting: Family Medicine

## 2021-03-22 NOTE — Telephone Encounter (Signed)
Called pt. Pt states she woke up this morning with pain in one calf. Reports that after getting up and walking around the pain went away. Pt states she is feeling icky, like before a period. Reports PMS symptoms; offered [facility] to process these feelings. Pt declines at this time. Encouraged pt to follow up via MyChart. MyChart message sent so she can respond with any concerns.

## 2021-03-22 NOTE — Telephone Encounter (Signed)
Newly Pregnant, state she woke up this morning having leg pain she want to know if that is normal.

## 2021-03-26 NOTE — Progress Notes (Signed)
Patient was assessed and managed by nursing staff during this encounter. I have reviewed the chart and agree with the documentation and plan. I have also made any necessary editorial changes.  Aletha Halim, MD 03/26/2021 1:19 PM

## 2021-03-27 ENCOUNTER — Ambulatory Visit
Admission: EM | Admit: 2021-03-27 | Discharge: 2021-03-27 | Disposition: A | Discharge status: Home / Self Care | Payer: Managed Care, Other (non HMO) | Attending: Emergency Medicine | Admitting: Emergency Medicine

## 2021-03-27 ENCOUNTER — Other Ambulatory Visit: Payer: Self-pay

## 2021-03-27 DIAGNOSIS — K5904 Chronic idiopathic constipation: Secondary | ICD-10-CM | POA: Diagnosis not present

## 2021-03-27 DIAGNOSIS — K2970 Gastritis, unspecified, without bleeding: Secondary | ICD-10-CM

## 2021-03-27 DIAGNOSIS — K2990 Gastroduodenitis, unspecified, without bleeding: Secondary | ICD-10-CM

## 2021-03-27 DIAGNOSIS — R1011 Right upper quadrant pain: Secondary | ICD-10-CM | POA: Diagnosis not present

## 2021-03-27 DIAGNOSIS — Z3201 Encounter for pregnancy test, result positive: Secondary | ICD-10-CM | POA: Diagnosis not present

## 2021-03-27 MED ORDER — CALCIUM CARBONATE ANTACID 500 MG PO CHEW: 1.0000 | CHEWABLE_TABLET | Freq: Four times a day (QID) | ORAL | 3 refills | Status: AC | PRN

## 2021-03-27 MED ORDER — LANSOPRAZOLE 30 MG PO CPDR: 30.0000 mg | DELAYED_RELEASE_CAPSULE | Freq: Every day | ORAL | 3 refills | Status: DC

## 2021-03-27 MED ORDER — POLYETHYLENE GLYCOL 3350 17 GM/SCOOP PO POWD: 17.0000 g | Freq: Every day | ORAL | 3 refills | Status: AC

## 2021-03-27 NOTE — Discharge Instructions (Addendum)
Thank you for coming into urgent care today.  I wish you all the best during your pregnancy.    Please begin MiraLAX, 1 capful in 8 ounces of your breakfast beverage, daily to help reduce constipation.  If you feel that you need to adjust this dose either higher or lower to achieve a stool that is the consistency of peanut butter, please feel free to do so.  If you adjust the dose, please take that same dose for least 3 days to ensure it is correct before changing it again.  Keep in mind, that your diet is not consistent every day; that is why it takes 3 days to adjust the MiraLAX dose.  For heartburn symptoms, please try taking Tums (calcium carbonate) 4 times daily as needed for heartburn symptoms.  If you know that you are going to eat a meal that typically causes you heartburn, it is also okay to take it prior to eating that meal.  If you feel that the Tums is inadequate at controlling your symptoms of heartburn, you can begin taking Prevacid which is safe to take during pregnancy.  I recommend that you take it daily with breakfast.  Prevacid is not recommended to be taken as needed because it works best when there is a steady state of medication in your system.  Please mention these recommendations to your OB/GYN when you see them to make sure that they are aware of your course of care.  Please follow-up with urgent care in the meantime if your symptoms persist and you do not get any relief.  Once the results of your hCG level, complete blood count, metabolic panel and lipase level are received, you will be notified.  If there are any abnormal findings, recommendations for further evaluation will be made.

## 2021-03-27 NOTE — ED Provider Notes (Signed)
[facility]    CSN: [id number] Arrival date & time: 03/27/21  3818      History   Chief Complaint Chief Complaint  Patient presents with   Abdominal Pain    HPI Kathryn Allen is a 30 y.o. female.      Patient is accompanied by her husband today.  Patient reports having upper right quadrant abd pain and that she is [redacted] weeks pregnant although her last period was not a typical duration and her bleeding was lighter than normal. Patient denies any changes in appetites, she denies N/V/D. Patient states she had this type of episode before (in 2019) and was diagnosed with gastritis, she states she underwent colonoscopy and EGD.  Patient states she was prescribed Protonix which she never actually took, patient states that her symptoms ultimately subsided.  She states the reason she never took the Protonix was because she did not feel that the right upper quadrant pain was related to her gastritis.  Patient reports a history of constipation, states symptoms are intermittent.  States that this time she typically has a bowel movement every day but stool can vary in quality from large and soft to small and pebbly.  Patient states to manage her constipation, she typically eats an apple every day.  Patient states that her right upper quadrant pain is typically worse at night, sometimes wakes her up, but has noticed that it is worse when she has a large or particularly fatty meal.  Patient states that yesterday she ate a [place] burger with cheese and earlier in the day ate a frozen dinner which was a healthy choice brand.  Patient denies alcohol use at this time.  Patient states that pain in the right upper quadrant can be sharp, typically lasting about 5 minutes then will resolve on its own.  Patient denies an increase in belching or flatulence.  Patient states that this time she is taking a prenatal vitamin every day.  The history is provided by the patient and the spouse.   Past Medical  History:  Diagnosis Date   Allergies    Allergy    Anxiety    Gastritis    GERD (gastroesophageal reflux disease)     Patient Active Problem List   Diagnosis Date Noted   Gastroesophageal reflux disease 09/05/2017   Allergic rhinitis 12/17/2012    Past Surgical History:  Procedure Laterality Date   COLONOSCOPY     age 62    OB History     Gravida  1   Para  0   Term  0   Preterm  0   AB  0   Living  0      SAB  0   IAB  0   Ectopic  0   Multiple  0   Live Births  0            Home Medications    Prior to Admission medications   Medication Sig Start Date End Date Taking? Authorizing Provider  calcium carbonate (TUMS) 500 MG chewable tablet Chew 1 tablet (200 mg of elemental calcium total) by mouth 4 (four) times daily as needed for indigestion or heartburn. 03/27/21 06/25/21 Yes Lynden Oxford Scales, PA-C  lansoprazole (PREVACID) 30 MG capsule Take 1 capsule (30 mg total) by mouth daily with breakfast. 03/27/21 06/25/21 Yes Lynden Oxford Scales, PA-C  polyethylene glycol powder (MIRALAX) 17 GM/SCOOP powder Take 17 g by mouth daily with breakfast for 90 doses. 03/27/21 06/25/21  Yes Lynden Oxford Scales, PA-C  Prenatal Vit-Fe Fumarate-FA (PRENATAL PLUS PO) Take by mouth.    [provider]    Family History Family History  Problem Relation Age of Onset   Breast cancer Maternal Grandmother    Diabetes Maternal Grandmother    Irritable bowel syndrome Mother    Hyperlipidemia Mother    Hypertension Father    Stroke Paternal Grandmother    COPD Paternal Grandfather    Bladder Cancer Neg Hx    Kidney cancer Neg Hx    Colon cancer Neg Hx    Colon polyps Neg Hx    Esophageal cancer Neg Hx    Rectal cancer Neg Hx    Stomach cancer Neg Hx     Social History Social History   Tobacco Use   Smoking status: Never   Smokeless tobacco: Never  Vaping Use   Vaping Use: Never used  Substance Use Topics   Alcohol use: Never   Drug use: Never      Allergies   Shellfish allergy   Review of Systems Review of Systems Pertinent findings noted in history of present illness.   Physical Exam Triage Vital Signs ED Triage Vitals  Enc Vitals Group     BP 03/27/21 0839 124/77     Pulse Rate 03/27/21 0839 79     Resp 03/27/21 0839 18     Temp 03/27/21 0839 98.2 F (36.8 C)     Temp Source 03/27/21 0839 Oral     SpO2 03/27/21 0839 97 %     Weight --      Height --      Head Circumference --      Peak Flow --      Pain Score 03/27/21 0837 7     Pain Loc --      Pain Edu? --      Excl. in [place]? --    No data found.  Updated Vital Signs BP 124/77 (BP Location: Right Arm)   Pulse 79   Temp 98.2 F (36.8 C) (Oral)   Resp 18   LMP 02/16/2021   SpO2 97%   Visual Acuity Right Eye Distance:   Left Eye Distance:   Bilateral Distance:    Right Eye Near:   Left Eye Near:    Bilateral Near:     Physical Exam   UC Treatments / Results  Labs (all labs ordered are listed, but only abnormal results are displayed) Labs Reviewed  COMPREHENSIVE METABOLIC PANEL  LIPASE  HCG, QUANTITATIVE, PREGNANCY  CBC WITH DIFFERENTIAL/PLATELET    EKG   Radiology No results found.  Procedures Procedures (including critical care time)  Medications Ordered in UC Medications - No data to display  Initial Impression / Assessment and Plan / UC Course  I have reviewed the triage vital signs and the nursing notes.  Pertinent labs & imaging results that were available during my care of the patient were reviewed by me and considered in my medical decision making (see chart for details).     We will obtain a metabolic panel and a lipase as well as a CBC with differential to rule out liver dysfunction, gallbladder dysfunction/inflammation, infection.  Patient was advised that serum hCG would be helpful in determining how far along she is in her pregnancy given her unusual bleeding during her last period.  Patient was advised to begin  Tums for symptoms of heartburn, to be taken 4 times daily as needed.  Patient was also provided  with a prescription for Prevacid to fill should the Tums prove inadequate in treating her heartburn symptoms.  Please see discharge instructions for further details of recommendations.  Patient verbalized agreement understanding of plan, all questions were addressed. Final diagnoses:  Positive urine pregnancy test  Postprandial RUQ pain  Gastritis and gastroduodenitis  Chronic idiopathic constipation     Discharge Instructions      Thank you for coming into urgent care today.  I wish you all the best during your pregnancy.    Please begin MiraLAX, 1 capful in 8 ounces of your breakfast beverage, daily to help reduce constipation.  If you feel that you need to adjust this dose either higher or lower to achieve a stool that is the consistency of peanut butter, please feel free to do so.  If you adjust the dose, please take that same dose for least 3 days to ensure it is correct before changing it again.  Keep in mind, that your diet is not consistent every day; that is why it takes 3 days to adjust the MiraLAX dose.  For heartburn symptoms, please try taking Tums (calcium carbonate) 4 times daily as needed for heartburn symptoms.  If you know that you are going to eat a meal that typically causes you heartburn, it is also okay to take it prior to eating that meal.  If you feel that the Tums is inadequate at controlling your symptoms of heartburn, you can begin taking Prevacid which is safe to take during pregnancy.  I recommend that you take it daily with breakfast.  Prevacid is not recommended to be taken as needed because it works best when there is a steady state of medication in your system.  Please mention these recommendations to your OB/GYN when you see them to make sure that they are aware of your course of care.  Please follow-up with urgent care in the meantime if your symptoms persist and you do  not get any relief.  Once the results of your hCG level, complete blood count, metabolic panel and lipase level are received, you will be notified.  If there are any abnormal findings, recommendations for further evaluation will be made.     ED Prescriptions     Medication Sig Dispense Auth. Provider   calcium carbonate (TUMS) 500 MG chewable tablet Chew 1 tablet (200 mg of elemental calcium total) by mouth 4 (four) times daily as needed for indigestion or heartburn. 360 tablet Lynden Oxford Scales, PA-C   lansoprazole (PREVACID) 30 MG capsule Take 1 capsule (30 mg total) by mouth daily with breakfast. 90 capsule Lynden Oxford Scales, PA-C   polyethylene glycol powder (MIRALAX) 17 GM/SCOOP powder Take 17 g by mouth daily with breakfast for 90 doses. 1,530 g Lynden Oxford Scales, PA-C      PDMP not reviewed this encounter.   Lynden Oxford Scales, PA-C 03/27/21 1006

## 2021-03-27 NOTE — ED Triage Notes (Signed)
Patient reports having upper right quadrant abd pain, (patient is [redacted] weeks pregnant). Patient denies any changes in appetites, she denies N/V/D. Patient states she had this type of episode before (in 2019) and was diagnosed with gastritis.   Started: last night

## 2021-03-28 LAB — CBC WITH DIFFERENTIAL/PLATELET
Basophils Absolute: 0.1 10*3/uL (ref 0.0–0.2)
Basos: 1 %
EOS (ABSOLUTE): 0.3 10*3/uL (ref 0.0–0.4)
Eos: 4 %
Hematocrit: 38.9 % (ref 34.0–46.6)
Hemoglobin: 12.8 g/dL (ref 11.1–15.9)
Immature Grans (Abs): 0 10*3/uL (ref 0.0–0.1)
Immature Granulocytes: 0 %
Lymphocytes Absolute: 1.6 10*3/uL (ref 0.7–3.1)
Lymphs: 18 %
MCH: 30 pg (ref 26.6–33.0)
MCHC: 32.9 g/dL (ref 31.5–35.7)
MCV: 91 fL (ref 79–97)
Monocytes Absolute: 0.5 10*3/uL (ref 0.1–0.9)
Monocytes: 6 %
Neutrophils Absolute: 6.3 10*3/uL (ref 1.4–7.0)
Neutrophils: 71 %
Platelets: 102 10*3/uL — ABNORMAL LOW (ref 150–450)
RBC: 4.26 x10E6/uL (ref 3.77–5.28)
RDW: 12.7 % (ref 11.7–15.4)
WBC: 8.7 10*3/uL (ref 3.4–10.8)

## 2021-03-29 LAB — COMPREHENSIVE METABOLIC PANEL
ALT: 15 IU/L (ref 0–32)
AST: 16 IU/L (ref 0–40)
Albumin/Globulin Ratio: 1.7 (ref 1.2–2.2)
Albumin: 4.6 g/dL (ref 3.8–4.8)
Alkaline Phosphatase: 54 IU/L (ref 44–121)
BUN/Creatinine Ratio: 11 (ref 9–23)
BUN: 10 mg/dL (ref 6–20)
Bilirubin Total: 0.3 mg/dL (ref 0.0–1.2)
CO2: 19 mmol/L — ABNORMAL LOW (ref 20–29)
Calcium: 9.7 mg/dL (ref 8.7–10.2)
Chloride: 100 mmol/L (ref 96–106)
Creatinine, Ser: 0.87 mg/dL (ref 0.57–1.00)
Globulin, Total: 2.7 g/dL (ref 1.5–4.5)
Glucose: 86 mg/dL (ref 70–99)
Potassium: 4.2 mmol/L (ref 3.5–5.2)
Sodium: 133 mmol/L — ABNORMAL LOW (ref 134–144)
Total Protein: 7.3 g/dL (ref 6.0–8.5)
eGFR: 91 mL/min/{1.73_m2} (ref >59)

## 2021-03-29 LAB — LIPASE: Lipase: 17 U/L (ref 14–72)

## 2021-03-30 LAB — BETA HCG QUANT (REF LAB): hCG Quant: 8514 m[IU]/mL

## 2021-03-30 LAB — SPECIMEN STATUS REPORT

## 2021-04-20 LAB — HEPATITIS C ANTIBODY: HCV Ab: NEGATIVE

## 2021-04-20 LAB — OB RESULTS CONSOLE HEPATITIS B SURFACE ANTIGEN: Hepatitis B Surface Ag: NEGATIVE

## 2021-04-26 ENCOUNTER — Telehealth: Payer: Managed Care, Other (non HMO)

## 2021-04-26 ENCOUNTER — Encounter: Payer: Managed Care, Other (non HMO) | Admitting: Obstetrics and Gynecology

## 2021-05-03 ENCOUNTER — Encounter: Payer: Managed Care, Other (non HMO) | Admitting: Family Medicine

## 2021-05-07 LAB — OB RESULTS CONSOLE GC/CHLAMYDIA
Chlamydia: NEGATIVE
Gonorrhea: NEGATIVE

## 2021-05-07 LAB — OB RESULTS CONSOLE RUBELLA ANTIBODY, IGM: Rubella: IMMUNE

## 2021-06-24 NOTE — L&D Delivery Note (Signed)
Delivery Note ?At 1:14 AM a viable female was delivered via Vaginal, Spontaneous (Presentation: Left Occiput Anterior).  APGAR: 7, 9; weight  pending.  NICU attended delivery for preterm delivery with meconium. ?Placenta status: Spontaneous, Intact with marginal cord insertion.  Cord: 3 vessels with the following complications: None.  Cord pH: n/a ? ?Anesthesia: Epidural ?Episiotomy: None ?Lacerations:  2nd degree ?Suture Repair: 3.0 vicryl rapide ?Est. Blood Loss (mL):  200 ?Pathology: placenta for PPROM and meconium ? ?Mom to postpartum.  Baby to Couplet care / Skin to Skin. ? ?Jinny Blossom  ?10/24/2021, 1:37 AM ? ? ? ?

## 2021-06-24 NOTE — L&D Delivery Note (Signed)
Neonatology Note:  ? ?Attendance at Delivery: ? ?  I was asked by Dr. Lynnette Caffey to attend this NSVD at 35.[redacted]wks EGA.  The mother is a 31yo G1 with PTL.  She was given mag and BTMZ on admission 4/30.  PCN for GBS ppx when unknown. Good prenatal care.  ROM 10h 76mprior to delivery, fluid light meconium. Infant vigorous with good spontaneous cry and tone. +30 sec DCC done.  Needed moderate bulb suctioning. Brought to warmer and dried and stimulated.  HR >100.  Fair tone.  Lungs clear to ausc though shallow breathing. BBo2 given for ~30sec while adjusting pulse ox; reading Sao2 placed quite appropriate in mid 90s. Tone, resp effort and color improved.   Apgars 8 at 1 minute, 9 at 5 minutes.  Family updated briefly about general care of a late preterm infant (attention on ensuring mature breathing, temp, feeding ability).  To MBU in care of Pediatrician. ? ?[name] MD ? ?

## 2021-08-23 LAB — OB RESULTS CONSOLE HIV ANTIBODY (ROUTINE TESTING): HIV: NONREACTIVE

## 2021-10-21 ENCOUNTER — Inpatient Hospital Stay (HOSPITAL_COMMUNITY)
Admission: AD | Admit: 2021-10-21 | Discharge: 2021-10-26 | DRG: 805 | Disposition: A | Discharge status: Home / Self Care | Payer: Managed Care, Other (non HMO) | Attending: Obstetrics & Gynecology | Admitting: Obstetrics & Gynecology

## 2021-10-21 ENCOUNTER — Encounter (HOSPITAL_COMMUNITY): Payer: Self-pay

## 2021-10-21 ENCOUNTER — Other Ambulatory Visit: Payer: Self-pay

## 2021-10-21 DIAGNOSIS — O42913 Preterm premature rupture of membranes, unspecified as to length of time between rupture and onset of labor, third trimester: Principal | ICD-10-CM | POA: Diagnosis present

## 2021-10-21 DIAGNOSIS — R109 Unspecified abdominal pain: Secondary | ICD-10-CM | POA: Diagnosis not present

## 2021-10-21 DIAGNOSIS — O9912 Other diseases of the blood and blood-forming organs and certain disorders involving the immune mechanism complicating childbirth: Secondary | ICD-10-CM | POA: Diagnosis present

## 2021-10-21 DIAGNOSIS — D693 Immune thrombocytopenic purpura: Secondary | ICD-10-CM | POA: Diagnosis present

## 2021-10-21 DIAGNOSIS — Z3A35 35 weeks gestation of pregnancy: Secondary | ICD-10-CM | POA: Diagnosis not present

## 2021-10-21 DIAGNOSIS — O42919 Preterm premature rupture of membranes, unspecified as to length of time between rupture and onset of labor, unspecified trimester: Secondary | ICD-10-CM | POA: Diagnosis present

## 2021-10-21 LAB — CBC
HCT: 40.8 % (ref 36.0–46.0)
Hemoglobin: 13.1 g/dL (ref 12.0–15.0)
MCH: 29.7 pg (ref 26.0–34.0)
MCHC: 32.1 g/dL (ref 30.0–36.0)
MCV: 92.5 fL (ref 80.0–100.0)
Platelets: UNDETERMINED 10*3/uL (ref 150–400)
RBC: 4.41 MIL/uL (ref 3.87–5.11)
RDW: 16.4 % — ABNORMAL HIGH (ref 11.5–15.5)
WBC: 12.1 10*3/uL — ABNORMAL HIGH (ref 4.0–10.5)
nRBC: 2.1 % — ABNORMAL HIGH (ref 0.0–0.2)

## 2021-10-21 LAB — TYPE AND SCREEN
ABO/RH(D): O POS
Antibody Screen: NEGATIVE

## 2021-10-21 LAB — RAPID HIV SCREEN (HIV 1/2 AB+AG)
HIV 1/2 Antibodies: NONREACTIVE
HIV-1 P24 Antigen - HIV24: NONREACTIVE

## 2021-10-21 LAB — RPR: RPR Ser Ql: NONREACTIVE

## 2021-10-21 MED ORDER — LACTATED RINGERS IV SOLN: INTRAVENOUS | Status: DC

## 2021-10-21 MED ORDER — ACETAMINOPHEN 500 MG PO TABS
1000.0000 mg | ORAL_TABLET | Freq: Once | ORAL | Status: DC
  Filled 2021-10-21: qty 2

## 2021-10-21 MED ORDER — TERBUTALINE SULFATE 1 MG/ML IJ SOLN
0.2500 mg | Freq: Once | INTRAMUSCULAR | Status: AC
  Administered 2021-10-21: 0.25 mg via SUBCUTANEOUS
  Filled 2021-10-21: qty 1

## 2021-10-21 MED ORDER — FENTANYL CITRATE (PF) 100 MCG/2ML IJ SOLN
50.0000 ug | Freq: Once | INTRAMUSCULAR | Status: AC
  Administered 2021-10-21: 50 ug via INTRAVENOUS
  Filled 2021-10-21: qty 2

## 2021-10-21 MED ORDER — MAGNESIUM SULFATE BOLUS VIA INFUSION
4.0000 g | Freq: Once | INTRAVENOUS | Status: AC
  Administered 2021-10-21: 4 g via INTRAVENOUS
  Filled 2021-10-21: qty 1000

## 2021-10-21 MED ORDER — PRENATAL MULTIVITAMIN CH
1.0000 | ORAL_TABLET | Freq: Every day | ORAL | Status: DC
  Administered 2021-10-21 – 2021-10-23 (×3): 1 via ORAL
  Filled 2021-10-21 (×3): qty 1

## 2021-10-21 MED ORDER — CALCIUM CARBONATE ANTACID 500 MG PO CHEW
2.0000 | CHEWABLE_TABLET | ORAL | Status: DC | PRN
  Administered 2021-10-22 – 2021-10-23 (×2): 400 mg via ORAL
  Filled 2021-10-21 (×2): qty 2

## 2021-10-21 MED ORDER — ONDANSETRON HCL 4 MG/2ML IJ SOLN
4.0000 mg | Freq: Once | INTRAMUSCULAR | Status: DC
  Filled 2021-10-21: qty 2

## 2021-10-21 MED ORDER — MAGNESIUM SULFATE 40 GM/1000ML IV SOLN
2.0000 g/h | INTRAVENOUS | Status: DC
  Administered 2021-10-21: 2 g/h via INTRAVENOUS

## 2021-10-21 MED ORDER — MAGNESIUM SULFATE 4 GM/100ML IV SOLN: 4.0000 g | Freq: Once | INTRAVENOUS | Status: DC

## 2021-10-21 MED ORDER — MAGNESIUM SULFATE 40 GM/1000ML IV SOLN
2.0000 g/h | Freq: Once | INTRAVENOUS | Status: AC
  Administered 2021-10-21: 2 g/h via INTRAVENOUS
  Filled 2021-10-21: qty 1000

## 2021-10-21 MED ORDER — DOCUSATE SODIUM 100 MG PO CAPS
100.0000 mg | ORAL_CAPSULE | Freq: Every day | ORAL | Status: DC
  Administered 2021-10-21 – 2021-10-22 (×2): 100 mg via ORAL
  Filled 2021-10-21 (×2): qty 1

## 2021-10-21 MED ORDER — BETAMETHASONE SOD PHOS & ACET 6 (3-3) MG/ML IJ SUSP
12.0000 mg | INTRAMUSCULAR | Status: AC
  Administered 2021-10-21 – 2021-10-22 (×2): 12 mg via INTRAMUSCULAR
  Filled 2021-10-21: qty 5

## 2021-10-21 MED ORDER — LACTATED RINGERS IV BOLUS
1000.0000 mL | Freq: Once | INTRAVENOUS | Status: AC
  Administered 2021-10-21: 1000 mL via INTRAVENOUS

## 2021-10-21 MED ORDER — ZOLPIDEM TARTRATE 5 MG PO TABS: 5.0000 mg | ORAL_TABLET | Freq: Every evening | ORAL | Status: DC | PRN

## 2021-10-21 MED ORDER — ACETAMINOPHEN 325 MG PO TABS
650.0000 mg | ORAL_TABLET | ORAL | Status: DC | PRN
Start: 2021-10-21 — End: 2021-10-24
  Administered 2021-10-21: 650 mg via ORAL
  Filled 2021-10-21: qty 2

## 2021-10-21 NOTE — Progress Notes (Signed)
I checked on the patient. She is sleeping.  ?Mother present. ?Contractions spaced. ?Nausea has decreased with zofran ? ?FHR Category 1 ?Toco UCs spaced ?Continue Magnesium  ?Questions answered at the bedside ?

## 2021-10-21 NOTE — MAU Provider Note (Addendum)
?History  ?  ? ?CSN: [id number] ? ?Arrival date and time: 10/21/21 8099 ? ? Event Date/Time  ? First Provider Initiated Contact with Patient 10/21/21 [phone]   ?  ? ?Chief Complaint  ?Patient presents with  ? Abdominal Pain  ? ?HPI ? ?[name] is a 31 y.o. female G1P0000 @ 49w2dhere with contractions. The contractions have kept her up most of the night. She reports contractions off and on for a couple of days, however these contractions are stronger and keeping her awake.  The contractions became more intense around 0130. She denies LOF or vaginal bleeding. + fetal movement. Reports + FFN in the office 2 weeks ago.  ? ?OB History   ? ? Gravida  ?1  ? Para  ?0  ? Term  ?0  ? Preterm  ?0  ? AB  ?0  ? Living  ?0  ?  ? ? SAB  ?0  ? IAB  ?0  ? Ectopic  ?0  ? Multiple  ?0  ? Live Births  ?0  ?   ?  ?  ? ? ?Past Medical History:  ?Diagnosis Date  ? Allergies   ? Allergy   ? Anxiety   ? Gastritis   ? GERD (gastroesophageal reflux disease)   ? ? ?Past Surgical History:  ?Procedure Laterality Date  ? COLONOSCOPY    ? age 31 ? ? ?Family History  ?Problem Relation Age of Onset  ? Breast cancer Maternal Grandmother   ? Diabetes Maternal Grandmother   ? Irritable bowel syndrome Mother   ? Hyperlipidemia Mother   ? Hypertension Father   ? Stroke Paternal Grandmother   ? COPD Paternal Grandfather   ? Bladder Cancer Neg Hx   ? Kidney cancer Neg Hx   ? Colon cancer Neg Hx   ? Colon polyps Neg Hx   ? Esophageal cancer Neg Hx   ? Rectal cancer Neg Hx   ? Stomach cancer Neg Hx   ? ? ?Social History  ? ?Tobacco Use  ? Smoking status: Never  ? Smokeless tobacco: Never  ?Vaping Use  ? Vaping Use: Never used  ?Substance Use Topics  ? Alcohol use: Never  ? Drug use: Never  ? ? ?Allergies:  ?Allergies  ?Allergen Reactions  ? Shellfish Allergy Hives  ? ? ?Medications Prior to Admission  ?Medication Sig Dispense Refill Last Dose  ? Prenatal Vit-Fe Fumarate-FA (PRENATAL PLUS PO) Take by mouth.   10/20/2021  ? lansoprazole (PREVACID) 30  MG capsule Take 1 capsule (30 mg total) by mouth daily with breakfast. 90 capsule 3   ? ?No results found for this or any previous visit (from the past 48 hour(s)).  ? ?Review of Systems  ?Constitutional:  Negative for fever.  ?Gastrointestinal:  Positive for abdominal pain.  ?Genitourinary:  Negative for vaginal bleeding and vaginal discharge.  ?Physical Exam  ? ?Blood pressure 129/83, pulse 87, temperature 98.4 ?F (36.9 ?C), temperature source Oral, resp. rate 14, last menstrual period 02/16/2021. ? ?Physical Exam ?Constitutional:   ?   General: She is not in acute distress. ?   Appearance: She is well-developed. She is not ill-appearing, toxic-appearing or diaphoretic.  ?Abdominal:  ?   Palpations: Abdomen is soft.  ?Genitourinary: ?   Comments: Dilation: 1.5 ?Effacement (%): 60 ?Cervical Position: Anterior ?Exam by:: JAnderson Malta NP  ?Skin: ?   General: Skin is warm.  ?Neurological:  ?   Mental Status: She is alert.  ? ?Reassessment @  0911: Patient reporting no pain with the Fentanyl. She now reports pain from UC's; rated 3/10. She is requesting to have more IV pain medication. ? ?Dilation: 2.5 (ve 2-2.5 as per provider) ?Effacement (%): 60 ?Cervical Position: Anterior ?Station: -3 ?Exam by:: R. ,CNM  ? ?REACTIVE NST @ 0911- FHR: 125 bpm / moderate variability / accels present / decels absent / TOCO: regular every 1-3.5 mins  ?MAU Course  ?Procedures ? ?MDM ? ?Category 1 fetal tracing. 125 bpm baseline, moderate variabiltiy. 15x15 accels, no decels. ?LR bolus with tylenol. Fentanyl ordered PRN, patient will let us know if she wants it.  ?Report given to [place] who resumes care of the patient. Plan to recheck her cervix around 0900.  ? ?Rasch, Artist Pais, NP ? ?0920: Dr. Helane Rima on unit. Report given and care assumed by Dr. Helane Rima. Verbal orders to administer Terbutaline 0.25 mg SQ while awaiting admission. ? ?Assessment and Plan  ?Preterm Labor ?31 y.o. G1P0000 at 18w2dgestation ?Admit to  OBSCU ?Dr. GHelane Rimaassumes care of patient at 0920 ? ? ?RLaury Deep CNM  ?10/21/2021 9:33 AM  ?

## 2021-10-21 NOTE — MAU Note (Addendum)
Patient arrived to MAU with CO abdominal pain. Pain started around 0130 this am. Patient rates the pain at 3/10. Pain is described at sharp pain and pressure in lower abdomen that is constant but also feels tightness in upper and that comes and goes. Patient denies LOF of VB. Patient endorses +FM ?

## 2021-10-21 NOTE — H&P (Signed)
Kathryn Allen is a 31 y.o. G 1 P 0 at 14 w 2 days presents from MAU for admission for PTL ?History of Positive FFN in office  ?Noted contractions today - cervix in MAU changed from 1.5 - 2 cm and contractions every 2 minutes requiring fentanyl ? ?I arrived in MAU at 921 to inquire about the patient since I had not been called by the provider and I am admitting the patient for management of PTL. ?OB History   ? ? Gravida  ?1  ? Para  ?0  ? Term  ?0  ? Preterm  ?0  ? AB  ?0  ? Living  ?0  ?  ? ? SAB  ?0  ? IAB  ?0  ? Ectopic  ?0  ? Multiple  ?0  ? Live Births  ?0  ?   ?  ?  ? ?Past Medical History:  ?Diagnosis Date  ? Allergies   ? Allergy   ? Anxiety   ? Gastritis   ? GERD (gastroesophageal reflux disease)   ? ?Past Surgical History:  ?Procedure Laterality Date  ? COLONOSCOPY    ? age 59  ? ?Family History: family history includes Breast cancer in her maternal grandmother; COPD in her paternal grandfather; Diabetes in her maternal grandmother; Hyperlipidemia in her mother; Hypertension in her father; Irritable bowel syndrome in her mother; Stroke in her paternal grandmother. ?Social History:  reports that she has never smoked. She has never used smokeless tobacco. She reports that she does not drink alcohol and does not use drugs. ? ? ?  ?Maternal Diabetes: No ?Genetic Screening: Normal ?Maternal Ultrasounds/Referrals: Normal ?Fetal Ultrasounds or other Referrals:  None ?Maternal Substance Abuse:  No ?Significant Maternal Medications:  None ?Significant Maternal Lab Results:  None ?Other Comments:  None ? ?Review of Systems  ?All other systems reviewed and are negative. ?Maternal Medical History:  ?Reason for admission: Contractions.  ? ? ?Dilation: 2.5 (ve 2-2.5 as per provider) ?Effacement (%): 60 ?Station: -3 ?Exam by:: R. Dawson,CNM ?Blood pressure 129/83, pulse 87, temperature 98.4 ?F (36.9 ?C), temperature source Oral, resp. rate 14, last menstrual period 02/16/2021. ?Maternal Exam:  ?Uterine Assessment:  Contraction strength is moderate.  Contraction frequency is regular.  ?Abdomen: Fetal presentation: vertex ? ? ?Fetal Exam ?Fetal State Assessment: Category I - tracings are normal. ? ?Physical Exam ?Vitals and nursing note reviewed. Exam conducted with a chaperone present.  ?Constitutional:   ?   Appearance: She is well-developed.  ?HENT:  ?   Head: Normocephalic.  ?Cardiovascular:  ?   Rate and Rhythm: Normal rate and regular rhythm.  ?Pulmonary:  ?   Effort: Pulmonary effort is normal.  ?Abdominal:  ?   General: Bowel sounds are normal.  ?Neurological:  ?   Mental Status: She is alert.  ?  ?Prenatal labs: ?ABO, Rh:   ?Antibody:   ?Rubella:   ?RPR:    ?HBsAg:    ?HIV:    ?GBS:    ?No results found for this or any previous visit (from the past 24 hour(s)). ? ?Assessment/Plan: ?IUP at 35 weeks and 2 days ?Positive FFN ?PTL ?Admit  ?Magnesium for tocolyisis  ?Steroid series ?GBBS culture  ?  ? ?Cyril Mourning ?10/21/2021, 9:20 AM ? ? ? ? ?

## 2021-10-22 MED ORDER — MENTHOL 3 MG MT LOZG
1.0000 | LOZENGE | OROMUCOSAL | Status: DC | PRN
  Administered 2021-10-22: 3 mg via ORAL
  Filled 2021-10-22 (×2): qty 9

## 2021-10-22 MED ORDER — PANTOPRAZOLE SODIUM 40 MG PO TBEC
40.0000 mg | DELAYED_RELEASE_TABLET | Freq: Every day | ORAL | Status: DC
  Administered 2021-10-22 – 2021-10-23 (×2): 40 mg via ORAL
  Filled 2021-10-22 (×2): qty 1

## 2021-10-22 MED ORDER — TERBUTALINE SULFATE 1 MG/ML IJ SOLN
0.2500 mg | Freq: Once | INTRAMUSCULAR | Status: AC
  Administered 2021-10-22: 0.25 mg via SUBCUTANEOUS
  Filled 2021-10-22: qty 1

## 2021-10-22 MED ORDER — LORATADINE 10 MG PO TABS
10.0000 mg | ORAL_TABLET | Freq: Every day | ORAL | Status: DC
  Administered 2021-10-22: 10 mg via ORAL
  Filled 2021-10-22 (×2): qty 1

## 2021-10-22 NOTE — Progress Notes (Signed)
Overnight events:  ?Magnesium stopped s/s patient intolerance. ?One dose of terbutaline given at 4am with resolution of contractions. ? ?O:  ?Vitals:  ? 10/22/21 0723 10/22/21 1119  ?BP: 118/62 114/69  ?Pulse: 98 (!) 105  ?Resp: (!) 21 18  ?Temp: 98.6 ?F (37 ?C) 97.9 ?F (36.6 ?C)  ?SpO2: 100% 99%  ? ?NAD, A&O ?NWOB ?Abd soft, nondistended, gravid ?SVE unchanged - 2.5/60/-3, vertex ? ?A/P: Pt is a 31 yo G1P0 here for PTL now with stable exam x 12 hours.  ?- BMZ #2 today ?- Expectant management given > 34 wga ?- given contractions spaced out, will do NST TID ?- GBS done and pending ? ? ?Lucillie Garfinkel MD ? ?

## 2021-10-23 ENCOUNTER — Inpatient Hospital Stay (HOSPITAL_COMMUNITY): Payer: Managed Care, Other (non HMO) | Admitting: Anesthesiology

## 2021-10-23 DIAGNOSIS — O42919 Preterm premature rupture of membranes, unspecified as to length of time between rupture and onset of labor, unspecified trimester: Secondary | ICD-10-CM | POA: Diagnosis present

## 2021-10-23 LAB — CULTURE, BETA STREP (GROUP B ONLY)

## 2021-10-23 LAB — CBC
HCT: 32 % — ABNORMAL LOW (ref 36.0–46.0)
HCT: 30.9 % — ABNORMAL LOW (ref 36.0–46.0)
Hemoglobin: 10.6 g/dL — ABNORMAL LOW (ref 12.0–15.0)
Hemoglobin: 10 g/dL — ABNORMAL LOW (ref 12.0–15.0)
MCH: 30.8 pg (ref 26.0–34.0)
MCH: 30 pg (ref 26.0–34.0)
MCHC: 33.1 g/dL (ref 30.0–36.0)
MCHC: 32.4 g/dL (ref 30.0–36.0)
MCV: 93 fL (ref 80.0–100.0)
MCV: 92.8 fL (ref 80.0–100.0)
Platelets: 93 10*3/uL — ABNORMAL LOW (ref 150–400)
Platelets: 95 10*3/uL — ABNORMAL LOW (ref 150–400)
RBC: 3.44 MIL/uL — ABNORMAL LOW (ref 3.87–5.11)
RBC: 3.33 MIL/uL — ABNORMAL LOW (ref 3.87–5.11)
RDW: 17.3 % — ABNORMAL HIGH (ref 11.5–15.5)
RDW: 17.2 % — ABNORMAL HIGH (ref 11.5–15.5)
WBC: 19.7 10*3/uL — ABNORMAL HIGH (ref 4.0–10.5)
WBC: 17.1 10*3/uL — ABNORMAL HIGH (ref 4.0–10.5)
nRBC: 2.8 % — ABNORMAL HIGH (ref 0.0–0.2)
nRBC: 3.4 % — ABNORMAL HIGH (ref 0.0–0.2)

## 2021-10-23 LAB — AMNISURE RUPTURE OF MEMBRANE (ROM) NOT AT ARMC: Amnisure ROM: POSITIVE

## 2021-10-23 MED ORDER — LACTATED RINGERS IV SOLN: 500.0000 mL | Freq: Once | INTRAVENOUS | Status: DC

## 2021-10-23 MED ORDER — FENTANYL CITRATE (PF) 100 MCG/2ML IJ SOLN
50.0000 ug | INTRAMUSCULAR | Status: DC | PRN
  Administered 2021-10-23: 50 ug via INTRAVENOUS
  Filled 2021-10-23: qty 2

## 2021-10-23 MED ORDER — FAMOTIDINE IN NACL 20-0.9 MG/50ML-% IV SOLN
20.0000 mg | Freq: Once | INTRAVENOUS | Status: AC
  Administered 2021-10-23: 20 mg via INTRAVENOUS
  Filled 2021-10-23: qty 50

## 2021-10-23 MED ORDER — FENTANYL-BUPIVACAINE-NACL 0.5-0.125-0.9 MG/250ML-% EP SOLN: 12.0000 mL/h | EPIDURAL | Status: DC | PRN

## 2021-10-23 MED ORDER — EPHEDRINE 5 MG/ML INJ: 10.0000 mg | INTRAVENOUS | Status: DC | PRN

## 2021-10-23 MED ORDER — LIDOCAINE HCL (PF) 1 % IJ SOLN: 30.0000 mL | INTRAMUSCULAR | Status: DC | PRN

## 2021-10-23 MED ORDER — LIDOCAINE HCL (PF) 1 % IJ SOLN
INTRAMUSCULAR | Status: DC | PRN
  Administered 2021-10-23: 8 mL via EPIDURAL

## 2021-10-23 MED ORDER — TERBUTALINE SULFATE 1 MG/ML IJ SOLN: 0.2500 mg | Freq: Once | INTRAMUSCULAR | Status: DC | PRN

## 2021-10-23 MED ORDER — LACTATED RINGERS IV SOLN: 500.0000 mL | INTRAVENOUS | Status: DC | PRN

## 2021-10-23 MED ORDER — SOD CITRATE-CITRIC ACID 500-334 MG/5ML PO SOLN: 30.0000 mL | ORAL | Status: DC | PRN

## 2021-10-23 MED ORDER — SODIUM CHLORIDE 0.9 % IV SOLN: 5.0000 10*6.[IU] | Freq: Once | INTRAVENOUS | Status: DC

## 2021-10-23 MED ORDER — LACTATED RINGERS IV SOLN: INTRAVENOUS | Status: DC

## 2021-10-23 MED ORDER — OXYTOCIN-SODIUM CHLORIDE 30-0.9 UT/500ML-% IV SOLN
1.0000 m[IU]/min | INTRAVENOUS | Status: DC
  Filled 2021-10-23: qty 500

## 2021-10-23 MED ORDER — DIPHENHYDRAMINE HCL 50 MG/ML IJ SOLN: 12.5000 mg | INTRAMUSCULAR | Status: DC | PRN

## 2021-10-23 MED ORDER — PENICILLIN G POT IN DEXTROSE 60000 UNIT/ML IV SOLN: 3.0000 10*6.[IU] | INTRAVENOUS | Status: DC

## 2021-10-23 MED ORDER — OXYTOCIN-SODIUM CHLORIDE 30-0.9 UT/500ML-% IV SOLN
INTRAVENOUS | Status: AC
  Administered 2021-10-23: 2 m[IU]/min via INTRAVENOUS
  Filled 2021-10-23: qty 500

## 2021-10-23 MED ORDER — OXYTOCIN BOLUS FROM INFUSION
333.0000 mL | Freq: Once | INTRAVENOUS | Status: AC
  Administered 2021-10-24: 333 mL via INTRAVENOUS

## 2021-10-23 MED ORDER — OXYCODONE-ACETAMINOPHEN 5-325 MG PO TABS: 2.0000 | ORAL_TABLET | ORAL | Status: DC | PRN

## 2021-10-23 MED ORDER — OXYTOCIN-SODIUM CHLORIDE 30-0.9 UT/500ML-% IV SOLN
2.5000 [IU]/h | INTRAVENOUS | Status: DC
  Administered 2021-10-24: 2.5 [IU]/h via INTRAVENOUS
  Filled 2021-10-23: qty 500

## 2021-10-23 MED ORDER — ONDANSETRON HCL 4 MG/2ML IJ SOLN: 4.0000 mg | Freq: Four times a day (QID) | INTRAMUSCULAR | Status: DC | PRN

## 2021-10-23 MED ORDER — ACETAMINOPHEN 325 MG PO TABS: 650.0000 mg | ORAL_TABLET | ORAL | Status: DC | PRN

## 2021-10-23 MED ORDER — PHENYLEPHRINE 80 MCG/ML (10ML) SYRINGE FOR IV PUSH (FOR BLOOD PRESSURE SUPPORT): 80.0000 ug | PREFILLED_SYRINGE | INTRAVENOUS | Status: DC | PRN

## 2021-10-23 MED ORDER — PHENYLEPHRINE 80 MCG/ML (10ML) SYRINGE FOR IV PUSH (FOR BLOOD PRESSURE SUPPORT)
80.0000 ug | PREFILLED_SYRINGE | INTRAVENOUS | Status: DC | PRN
Start: 2021-10-23 — End: 2021-10-24
  Filled 2021-10-23: qty 10

## 2021-10-23 MED ORDER — SODIUM CHLORIDE 0.9 % IV SOLN
5.0000 10*6.[IU] | Freq: Once | INTRAVENOUS | Status: AC
  Administered 2021-10-23: 5 10*6.[IU] via INTRAVENOUS
  Filled 2021-10-23: qty 5

## 2021-10-23 MED ORDER — FENTANYL-BUPIVACAINE-NACL 0.5-0.125-0.9 MG/250ML-% EP SOLN
12.0000 mL/h | EPIDURAL | Status: DC | PRN
  Administered 2021-10-23: 12 mL/h via EPIDURAL
  Filled 2021-10-23: qty 250

## 2021-10-23 MED ORDER — PENICILLIN G POT IN DEXTROSE 60000 UNIT/ML IV SOLN
3.0000 10*6.[IU] | INTRAVENOUS | Status: DC
  Administered 2021-10-23 (×2): 3 10*6.[IU] via INTRAVENOUS
  Filled 2021-10-23 (×4): qty 50

## 2021-10-23 MED ORDER — OXYCODONE-ACETAMINOPHEN 5-325 MG PO TABS: 1.0000 | ORAL_TABLET | ORAL | Status: DC | PRN

## 2021-10-23 NOTE — Anesthesia Preprocedure Evaluation (Signed)
Anesthesia Evaluation  ?Patient identified by MRN, date of birth, ID band ?Patient awake ? ? ? ?Reviewed: ?Allergy & Precautions, H&P , NPO status , Patient's Chart, lab work & pertinent test results, reviewed documented beta blocker date and time  ? ?Airway ?Mallampati: I ? ?TM Distance: >3 FB ?Neck ROM: full ? ? ? Dental ?no notable dental hx. ?(+) Teeth Intact, Dental Advisory Given ?  ?Pulmonary ?neg pulmonary ROS,  ?  ?Pulmonary exam normal ?breath sounds clear to auscultation ? ? ? ? ? ? Cardiovascular ?negative cardio ROS ?Normal cardiovascular exam ?Rhythm:regular Rate:Normal ? ? ?  ?Neuro/Psych ?PSYCHIATRIC DISORDERS Anxiety negative neurological ROS ?   ? GI/Hepatic ?Neg liver ROS, GERD  ,  ?Endo/Other  ?negative endocrine ROS ? Renal/GU ?negative Renal ROS  ?negative genitourinary ?  ?Musculoskeletal ? ? Abdominal ?  ?Peds ? Hematology ?negative hematology ROS ?(+)   ?Anesthesia Other Findings ?Idiopathic thrombocytopenia ? Reproductive/Obstetrics ?(+) Pregnancy ? ?  ? ? ? ? ? ? ? ? ? ? ? ? ? ?  ?  ? ? ? ? ? ? ? ? ?Anesthesia Physical ?Anesthesia Plan ? ?ASA: 2 ? ?Anesthesia Plan: Epidural  ? ?Post-op Pain Management: Minimal or no pain anticipated  ? ?Induction: Intravenous ? ?PONV Risk Score and Plan: 2 ? ?Airway Management Planned: Natural Airway ? ?Additional Equipment: None ? ?Intra-op Plan:  ? ?Post-operative Plan:  ? ?Informed Consent: I have reviewed the patients History and Physical, chart, labs and discussed the procedure including the risks, benefits and alternatives for the proposed anesthesia with the patient or authorized representative who has indicated his/her understanding and acceptance.  ? ? ? ?Dental Advisory Given ? ?Plan Discussed with: Anesthesiologist and CRNA ? ?Anesthesia Plan Comments: (Labs checked- platelets confirmed with RN in room. Fetal heart tracing, per RN, reported to be stable enough for sitting procedure. ?Discussed epidural, and  patient consents to the procedure:  included risk of possible headache,backache, failed block, allergic reaction, and nerve injury. This patient was asked if she had any questions or concerns before the procedure started.)  ? ? ? ? ? ? ?Anesthesia Quick Evaluation ? ?

## 2021-10-23 NOTE — Progress Notes (Signed)
Kathryn Allen is a 31 y.o. G1P0000 at 72w4dby ultrasound admitted for augmentation of labor; PPROM ? ?Subjective: ?Comfortable with CLEA. ? ?Objective: ?BP 122/68   Pulse 87   Temp 97.7 ?F (36.5 ?C) (Oral)   Resp 18   Ht '5\' 3"'$  (1.6 m)   Wt 76.7 kg   LMP 02/16/2021 (Approximate)   SpO2 100%   BMI 29.94 kg/m?  ?I/O last 3 completed shifts: ?In: 2680.4 [P.O.:1920; I.V.:760.4] ?Out: 3000 [Urine:3000] ?Total I/O ?In: -  ?Out: 1275 [[GHWEX:9371]? ?FHT:  FHR: 140 bpm, variability: minimal ,  accelerations:  Present,  decelerations:  Present occasional variable ?UC:   irregular, every 2 minutes ?SVE:   Dilation: 7 ?Effacement (%): 90 ?Station: -1 ?Exam by:: Dr. MLynnette Caffey?IUPC placed ? ?Labs: ?Lab Results  ?Component Value Date  ? WBC 17.1 (H) 10/23/2021  ? HGB 10.0 (L) 10/23/2021  ? HCT 30.9 (L) 10/23/2021  ? MCV 92.8 10/23/2021  ? PLT 95 (L) 10/23/2021  ? ? ?Assessment / Plan: ?Augmentation of labor, progressing well ? ?Labor: Progressing normally and monitor MVUs; amnioinfusion if needed ?Preeclampsia:   n/a ?Fetal Wellbeing:  Category I and Category II ?Pain Control:  Epidural ?I/D:  n/a ?Anticipated MOD:  NSVD ? ?MJinny BlossomMorris ?10/23/2021, 11:23 PM ? ? ?

## 2021-10-23 NOTE — Progress Notes (Signed)
Kathryn Allen is a 31 y.o. G1P0000 at 32w4dby ultrasound admitted for PTL and now PPROM at around 0430.   ? ?Subjective: ?Just arrived to L&D.  Feeling increasing intensity with CTX.  Would like another dose of fentanyl. ? ?Objective: ?BP 133/75 (BP Location: Right Arm)   Pulse (!) 101   Temp 98.1 ?F (36.7 ?C) (Oral)   Resp 16   Ht '5\' 3"'$  (1.6 m)   Wt 76.7 kg   LMP 02/16/2021 (Approximate)   SpO2 100%   BMI 29.94 kg/m?  ?I/O last 3 completed shifts: ?In: 47209[P.O.:2040; I.V.:2112] ?Out: 4500 [Urine:4500] ?No intake/output data recorded. ? ?FHT:  FHR: 145 bpm, variability: moderate,  accelerations:  Present,  decelerations:  Absent ?UC:   irregular, every 2-5 minutes ?SVE:   Dilation: 4.5 ?Effacement (%): 60 ?Station: -3 ?Exam by:: Dr. LRoyston Sinner? ?Labs: ?Lab Results  ?Component Value Date  ? WBC 12.1 (H) 10/21/2021  ? HGB 13.1 10/21/2021  ? HCT 40.8 10/21/2021  ? MCV 92.5 10/21/2021  ? PLT PLATELET CLUMPS NOTED ON SMEAR, UNABLE TO ESTIMATE 10/21/2021  ? ? ?Assessment / Plan: ?PPROM with labor ? ?Labor: Progressing normally ?Preeclampsia:   n/a ?Fetal Wellbeing:  Category I ?Pain Control:  IV pain meds ?I/D:  n/a ?Anticipated MOD:  NSVD ?PCN for GBS ppx ?S/P BMZ ? ?Kathryn Allen ?10/23/2021, 8:16 AM ? ? ?

## 2021-10-23 NOTE — Progress Notes (Signed)
GBS cx returned negative.  Will d/c PCN ?Also, thrombocytopenia on this am labs (93k).  Last PLT in office 4/6 137k.  Will repeat CBC. ? ?Linda Hedges, DO ?

## 2021-10-23 NOTE — Anesthesia Procedure Notes (Signed)
Epidural ?Patient location during procedure: OB ?Start time: 10/23/2021 10:19 AM ?End time: 10/23/2021 10:22 AM ? ?Staffing ?Anesthesiologist: Janeece Riggers, MD ? ?Preanesthetic Checklist ?Completed: patient identified, IV checked, site marked, risks and benefits discussed, surgical consent, monitors and equipment checked, pre-op evaluation and timeout performed ? ?Epidural ?Patient position: sitting ?Prep: DuraPrep and site prepped and draped ?Patient monitoring: continuous pulse ox and blood pressure ?Approach: midline ?Location: L3-L4 ?Injection technique: LOR air ? ?Needle:  ?Needle type: Tuohy  ?Needle gauge: 17 G ?Needle length: 9 cm and 9 ?Needle insertion depth: 6 cm ?Catheter type: closed end flexible ?Catheter size: 19 Gauge ?Catheter at skin depth: 12 cm ?Test dose: negative ? ?Assessment ?Events: blood not aspirated, injection not painful, no injection resistance, no paresthesia and negative IV test ? ? ? ?

## 2021-10-23 NOTE — Progress Notes (Signed)
Had gush of fluid at ~430am. Amniosure was done and positive. Reports contractions every 5 minutes that are tolerable. Mostly feels pressure.  ? ?O:  ?Vitals:  ? 10/22/21 2322 10/23/21 0409  ?BP: 125/73 133/75  ?Pulse: (!) 102 (!) 101  ?Resp: 16 19  ?Temp: 98.1 ?F (36.7 ?C) 98.1 ?F (36.7 ?C)  ?SpO2: 100% 100%  ? ?NAD, A&O ?NWOB ?Abd soft, nondistended, gravid ?Grossly ruptured with blood-tinged fluid on pad and on perineum SVE 4.5/60/-2, vertex ? ? ?31 yo admitted for PTL now with PPROM.  ?- counseled regarding risks of expectant management including abruption, infection, and IUFD. Discussed indications for delivery including infection, labor, abruption or non-reassuring fetal status.  ?- Immediate delivery vs expectant management was d/w patient in detail. ?I reviewed that historically, we delivered patients at 69 wga for PPROM. However, recent studies demonstrated that they are potential benefits to expectant management until 37.0 wga. We reviewed that latency antibiotics are not indicated if she desires expectant management. We would treat her presumptively with PCN for GBS unknown and this has been started. I reiterated that expectant management would include VERY careful monitoring of s/s of chorio, maternal infection, and antepartum hemorrhage.  ?- We discussed tocolysis is not typically indicated as it can be associated with a higher risk of chorioamnionitis. In active labor, it is never indicated ?- Latency abx are DEFERRED given > 34 wga ?- No s/s of abruption ?- s/p BMZ x 2 ?- d/w pt risks of prematurity ?- CEFM ?- Does have mild tachycardia and has now made cervical change to 4.5cm. I suspect labor is going to continue to progress and with tachycardia she is potentially developing chorio. Thus, I recommend pitocin augmentation if she does not continue to make cervical change. She will discuss this with her mother and FOB and let us know what she decides.  ?Questions answered in detail.  ? ? ?Arty Baumgartner MD ?

## 2021-10-23 NOTE — Progress Notes (Signed)
Kathryn Allen is a 30 y.o. G1P0000 at 51w4dby ultrasound admitted for PPROM ? ?Subjective: ?Comfortable with CLEA ? ?Objective: ?BP 128/67 (BP Location: Right Arm)   Pulse 98   Temp 97.9 ?F (36.6 ?C) (Oral)   Resp 18   Ht '5\' 3"'$  (1.6 m)   Wt 76.7 kg   LMP 02/16/2021 (Approximate)   SpO2 97%   BMI 29.94 kg/m?  ?I/O last 3 completed shifts: ?In: 48250[P.O.:2040; I.V.:2112] ?Out: 4500 [Urine:4500] ?No intake/output data recorded. ? ?FHT:  FHR: 140 bpm, variability: moderate,  accelerations:  Abscent,  decelerations:  Absent ?UC:   regular, every 2-3 minutes, pitocin 12 mU ?SVE:   4/50/-3, AROM forebag with light meconium ? ?Labs: ?Lab Results  ?Component Value Date  ? WBC 19.7 (H) 10/23/2021  ? HGB 10.6 (L) 10/23/2021  ? HCT 32.0 (L) 10/23/2021  ? MCV 93.0 10/23/2021  ? PLT 93 (L) 10/23/2021  ? ? ?Assessment / Plan: ?PPROM ? ?Labor:  continue pitocin and now s/p AROM forebag ?Preeclampsia:   n/a ?Fetal Wellbeing:  Category I ?Pain Control:  Epidural ?I/D:  n/a ?Anticipated MOD:  NSVD ? ?MJinny BlossomMorris ?10/23/2021, 2:53 PM ? ? ?

## 2021-10-23 NOTE — Progress Notes (Signed)
Kathryn Allen is a 31 y.o. G1P0000 at 103w4dby ultrasound admitted for augmentation of labor; PPROM ? ?Subjective: ?Comfortable with CLEA. ? ?Objective: ?BP 132/64 (BP Location: Right Arm)   Pulse 84   Temp 97.7 ?F (36.5 ?C) (Oral)   Resp 18   Ht '5\' 3"'$  (1.6 m)   Wt 76.7 kg   LMP 02/16/2021 (Approximate)   SpO2 100%   BMI 29.94 kg/m?  ?I/O last 3 completed shifts: ?In: 2680.4 [P.O.:1920; I.V.:760.4] ?Out: 3000 [Urine:3000] ?No intake/output data recorded. ? ?FHT:  FHR: 145 bpm, variability: minimal ,  accelerations:  Present,  decelerations:  Absent ?UC:   regular, every 2-3 minutes ?SVE:   Dilation: 6.5 ?Effacement (%): 70 ?Station: -1 ?Exam by:: RFreda MunroRN ? ?Labs: ?Lab Results  ?Component Value Date  ? WBC 17.1 (H) 10/23/2021  ? HGB 10.0 (L) 10/23/2021  ? HCT 30.9 (L) 10/23/2021  ? MCV 92.8 10/23/2021  ? PLT 95 (L) 10/23/2021  ? ? ?Assessment / Plan: ?Augmentation of labor, progressing well ? ?Labor: Progressing normally ?Preeclampsia:   n/a ?Fetal Wellbeing:  Category I ?Pain Control:  Epidural ?I/D:  n/a ?Anticipated MOD:  NSVD ? ?Kathryn Allen ?10/23/2021, 7:35 PM ? ? ?

## 2021-10-24 ENCOUNTER — Encounter (HOSPITAL_COMMUNITY): Payer: Self-pay | Admitting: Obstetrics & Gynecology

## 2021-10-24 LAB — CBC
HCT: 31.2 % — ABNORMAL LOW (ref 36.0–46.0)
Hemoglobin: 9.8 g/dL — ABNORMAL LOW (ref 12.0–15.0)
MCH: 29.8 pg (ref 26.0–34.0)
MCHC: 31.4 g/dL (ref 30.0–36.0)
MCV: 94.8 fL (ref 80.0–100.0)
Platelets: 81 10*3/uL — ABNORMAL LOW (ref 150–400)
RBC: 3.29 MIL/uL — ABNORMAL LOW (ref 3.87–5.11)
RDW: 17.6 % — ABNORMAL HIGH (ref 11.5–15.5)
WBC: 21.3 10*3/uL — ABNORMAL HIGH (ref 4.0–10.5)
nRBC: 4.6 % — ABNORMAL HIGH (ref 0.0–0.2)

## 2021-10-24 LAB — PATHOLOGIST SMEAR REVIEW

## 2021-10-24 MED ORDER — COCONUT OIL OIL: 1.0000 "application " | TOPICAL_OIL | Status: DC | PRN

## 2021-10-24 MED ORDER — DIBUCAINE (PERIANAL) 1 % EX OINT: 1.0000 "application " | TOPICAL_OINTMENT | CUTANEOUS | Status: DC | PRN

## 2021-10-24 MED ORDER — DIPHENHYDRAMINE HCL 25 MG PO CAPS: 25.0000 mg | ORAL_CAPSULE | Freq: Four times a day (QID) | ORAL | Status: DC | PRN

## 2021-10-24 MED ORDER — ZOLPIDEM TARTRATE 5 MG PO TABS: 5.0000 mg | ORAL_TABLET | Freq: Every evening | ORAL | Status: DC | PRN

## 2021-10-24 MED ORDER — ONDANSETRON HCL 4 MG PO TABS: 4.0000 mg | ORAL_TABLET | ORAL | Status: DC | PRN

## 2021-10-24 MED ORDER — SENNOSIDES-DOCUSATE SODIUM 8.6-50 MG PO TABS
2.0000 | ORAL_TABLET | Freq: Every day | ORAL | Status: DC
  Administered 2021-10-25 – 2021-10-26 (×2): 2 via ORAL
  Filled 2021-10-24 (×2): qty 2

## 2021-10-24 MED ORDER — TETANUS-DIPHTH-ACELL PERTUSSIS 5-2.5-18.5 LF-MCG/0.5 IM SUSY: 0.5000 mL | PREFILLED_SYRINGE | Freq: Once | INTRAMUSCULAR | Status: DC

## 2021-10-24 MED ORDER — OXYCODONE-ACETAMINOPHEN 5-325 MG PO TABS: 2.0000 | ORAL_TABLET | ORAL | Status: DC | PRN

## 2021-10-24 MED ORDER — PRENATAL MULTIVITAMIN CH
1.0000 | ORAL_TABLET | Freq: Every day | ORAL | Status: DC
  Administered 2021-10-25 – 2021-10-26 (×2): 1 via ORAL
  Filled 2021-10-24 (×2): qty 1

## 2021-10-24 MED ORDER — SIMETHICONE 80 MG PO CHEW: 80.0000 mg | CHEWABLE_TABLET | ORAL | Status: DC | PRN

## 2021-10-24 MED ORDER — ONDANSETRON HCL 4 MG/2ML IJ SOLN: 4.0000 mg | INTRAMUSCULAR | Status: DC | PRN

## 2021-10-24 MED ORDER — IBUPROFEN 600 MG PO TABS
600.0000 mg | ORAL_TABLET | Freq: Four times a day (QID) | ORAL | Status: DC
  Administered 2021-10-24 (×3): 600 mg via ORAL
  Filled 2021-10-24 (×5): qty 1

## 2021-10-24 MED ORDER — BENZOCAINE-MENTHOL 20-0.5 % EX AERO
1.0000 "application " | INHALATION_SPRAY | CUTANEOUS | Status: DC | PRN
  Administered 2021-10-24: 1 via TOPICAL
  Filled 2021-10-24: qty 56

## 2021-10-24 MED ORDER — OXYCODONE-ACETAMINOPHEN 5-325 MG PO TABS: 1.0000 | ORAL_TABLET | ORAL | Status: DC | PRN

## 2021-10-24 MED ORDER — ACETAMINOPHEN 325 MG PO TABS
650.0000 mg | ORAL_TABLET | ORAL | Status: DC | PRN
  Administered 2021-10-24 – 2021-10-25 (×2): 650 mg via ORAL
  Filled 2021-10-24 (×2): qty 2

## 2021-10-24 MED ORDER — WITCH HAZEL-GLYCERIN EX PADS
1.0000 "application " | MEDICATED_PAD | CUTANEOUS | Status: DC | PRN
  Administered 2021-10-24: 1 via TOPICAL

## 2021-10-24 NOTE — Progress Notes (Signed)
DBP's elevated this morning, ranging from 100-88 (see flowsheets).  Pt denies Headaches, sharp abdominal pain, vision changes, nausea.  Pt states, "I haven't slept that is why my BP's are high".  Rest encouraged throughout the day.   ? ?Evening BP=109/72 at 1758 today.  Pt resting. ? ?Family and FOB at bedside.  ? ?Dr. Mardelle Matte notified (of elevated BP's) via Epic secure chat. ?

## 2021-10-24 NOTE — Lactation Note (Signed)
This note was copied from a baby's chart. ?Lactation Consultation Note ? ?Patient Name: Kathryn Allen ?Today's Date: 10/24/2021 ?Reason for consult: L&D Initial assessment;1st time breastfeeding;Late-preterm 34-36.6wks;Infant < 6lbs (DAT+, less than 6 lbs, supplementing with donor breast milk.) ?Age:32 hours ?LC entered the room, mom was doing skin to skin. ?Mom attempted to latch infant, infant only licked and taste did not suckle at breast, stopped after 2 minutes. ?LC used breast model and mom was taught hand expression, mom self expressed but colostrum not present. ?Mom wants to use donor breast milk, mom signed consent and infant given 10 mls with White Nfant slow flow bottle nipple in L&D. ?LC briefly explained behaviors of LPTI to Family, RN on MBU will review in detail with family LPTI feeding policy and give green sheet, once  Mom/ Baby on MBU. ?[place] set up DEBP in Patient's room on MBU, RN will explained importance of mom pumping every 3 hours for 15 minutes to help stimulate and establish mom's milk supply. ?[place] unable see family on MBU,  due to family still in L&D and 10 minutes before LC shifts end.   ? ?Maternal Data ?Has patient been taught Hand Expression?: Yes ?Does the patient have breastfeeding experience prior to this delivery?: No ? ?Feeding ?Mother's Current Feeding Choice: Breast Milk and Donor Milk ? ?LATCH Score ?Latch: Too sleepy or reluctant, no latch achieved, no sucking elicited. ? ?Audible Swallowing: None ? ?Type of Nipple: Everted at rest and after stimulation ? ?Comfort (Breast/Nipple): Soft / non-tender ? ?Hold (Positioning): Assistance needed to correctly position infant at breast and maintain latch. ? ?LATCH Score: 5 ? ? ?Lactation Tools Discussed/Used ?Tools: Pump ?Breast pump type: Double-Electric Breast Pump ?Pump Education: Setup, frequency, and cleaning;Milk Storage ?Reason for Pumping: Infant is LPTI, DAT+ didn't latch ?Pumping frequency: Mom will pump every 3 hours for 15  minutes on inital setting, RN will discuss this with mom, LC set up DEBP in room, towards the end of LC shift. ? ?Interventions ?Interventions: Assisted with latch;Skin to skin;Breast compression;Adjust position;Support pillows;Position options;Education;LPT handout/interventions;Pace feeding ? ?Discharge ?  ? ?Consult Status ?Consult Status: Follow-up from L&D ? ? ? ?Vicente Serene ?10/24/2021, 3:08 AM ? ? ? ?

## 2021-10-24 NOTE — Anesthesia Postprocedure Evaluation (Signed)
Anesthesia Post Note ? ?Patient: Kathryn Allen ? ?Procedure(s) Performed: AN AD HOC LABOR EPIDURAL ? ?  ? ?Patient location during evaluation: Mother Baby ?Anesthesia Type: Epidural ?Level of consciousness: awake and alert ?Pain management: pain level controlled ?Respiratory status: spontaneous breathing ?Cardiovascular status: stable ?Postop Assessment: no headache, adequate PO intake, no backache, patient able to bend at knees, epidural receding and no apparent nausea or vomiting ?Anesthetic complications: no ? ? ?No notable events documented. ? ?Last Vitals:  ?Vitals:  ? 10/24/21 0905 10/24/21 1242  ?BP: (!) 126/92 (!) 128/99  ?Pulse: 84 77  ?Resp:  16  ?Temp:  36.8 ?C  ?SpO2:  98%  ?  ?Last Pain:  ?Vitals:  ? 10/24/21 1245  ?TempSrc:   ?PainSc: 0-No pain  ? ?Pain Goal: Patients Stated Pain Goal: 2 (10/23/21 0815) ? ?  ?  ?  ?  ?  ?  ?  ? ?,  Lelan Pons ? ? ? ? ?

## 2021-10-24 NOTE — Lactation Note (Signed)
This note was copied from a baby's chart. ?Lactation Consultation Note ? ?Patient Name: Kathryn Allen ?Today's Date: 10/24/2021 ?Reason for consult: Initial assessment;Mother's request;1st time breastfeeding;Primapara;Late-preterm 34-36.6wks;Breastfeeding assistance;Infant < 6lbs ?Age:31 years ? ?LC observed a 19 min feeding. Parents are getting ready to supplement with DBM.  ? ?Mom set up on DEBP q 3hrs for 15 min.  ? ?Plan 1. To feed based on cues 8-12x 24hr period. Mom to offer breasts and look for signs of milk transfer.  ?2. Mom to supplement with EBM first followed by [facility] with pace bottle feeding with Nfant nipple.  ?3. DEBP as stated above.  ?4. I and O sheet reviewed.  ?All questions answered at the end of the visit.  ?Maternal Data ?Has patient been taught Hand Expression?: Yes ?Does the patient have breastfeeding experience prior to this delivery?: No ? ?Feeding ?Mother's Current Feeding Choice: Breast Milk and Donor Milk ?Nipple Type: Slow - flow ? ?LATCH Score ?Latch: Repeated attempts needed to sustain latch, nipple held in mouth throughout feeding, stimulation needed to elicit sucking reflex. ? ?Audible Swallowing: Spontaneous and intermittent ? ?Type of Nipple: Everted at rest and after stimulation ? ?Comfort (Breast/Nipple): Soft / non-tender ? ?Hold (Positioning): Assistance needed to correctly position infant at breast and maintain latch. ? ?LATCH Score: 8 ? ? ?Lactation Tools Discussed/Used ?Tools: Pump;Flanges ?Flange Size: 24 ?Breast pump type: Double-Electric Breast Pump ?Pump Education: Setup, frequency, and cleaning;Milk Storage ?Reason for Pumping: increase stimulation ?Pumping frequency: every 3 hrs for 15 min ? ?Interventions ?Interventions: Breast feeding basics reviewed;Assisted with latch;Skin to skin;Breast massage;Hand express;Pre-pump if needed;Breast compression;Adjust position;Support pillows;Position options;Expressed milk;DEBP;Education;Pace feeding;LC Editor, commissioning;Infant Driven Feeding Algorithm education;LPT handout/interventions ? ?Discharge ?Pump: DEBP ?[place] Program: No ? ?Consult Status ?Consult Status: Follow-up ?Date: 10/25/21 ?Follow-up type: In-patient ? ? ? ?  Nicholson-Springer ?10/24/2021, 1:41 PM ? ? ? ?

## 2021-10-24 NOTE — Progress Notes (Signed)
Postpartum Progress Note ? ?Post Partum Day 0 s/p spontaneous vaginal delivery.  Patient reports well-controlled pain, ambulating without difficulty, voiding spontaneously, tolerating PO.  Vaginal bleeding is appropriate. ? ? ?Objective: ?Blood pressure 131/86, pulse 82, temperature 97.8 ?F (36.6 ?C), temperature source Oral, resp. rate 18, height '5\' 3"'$  (1.6 m), weight 76.7 kg, last menstrual period 02/16/2021, SpO2 100 %, unknown if currently breastfeeding. ? ?Physical Exam:  ?General: alert and no distress ?Lochia: appropriate ?Uterine Fundus: firm ?DVT Evaluation: No evidence of DVT seen on physical exam. ? ?Recent Labs  ?  10/23/21 ?1648 10/24/21 ?0306  ?HGB 10.0* 9.8*  ?HCT 30.9* 31.2*  ? ? ?Assessment/Plan: ?Postpartum Day 0, s/p vaginal delivery. ?Baby boy ?Continue routine postpartum care ?Lactation following ?Anticipate discharge home PPD1 or 2 ? ? LOS: 3 days  ? ?Carlyon Shadow ?10/24/2021, 7:44 AM  ? ? ?

## 2021-10-24 NOTE — Lactation Note (Signed)
This note was copied from a baby's chart. ?Lactation Consultation Note ? ?Patient Name: Boy Ariani Seier ?Today's Date: 10/24/2021 ?  ?Age:32 hours ? ?Mom resting on [place] arrival. Infant first 2 glucose normal, infant recent feeding of DBM 8 ml at 9:45 am. LC alerted RN, Neta Ehlers will come to assist with latching with next feeding.  ? ?Maternal Data ?  ? ?Feeding ?Nipple Type: Nfant Standard Flow (white) ? ?LATCH Score ?  ? ?  ? ?  ? ?  ? ?  ? ?  ? ? ?Lactation Tools Discussed/Used ?  ? ?Interventions ?  ? ?Discharge ?  ? ?Consult Status ?  ? ? ? ?  Nicholson-Springer ?10/24/2021, 11:42 AM ? ? ? ?

## 2021-10-24 NOTE — Plan of Care (Signed)
?  Problem: Clinical Measurements: ?Goal: Respiratory complications will improve ?Outcome: Progressing ?Goal: Cardiovascular complication will be avoided ?Outcome: Progressing ?  ?Problem: Activity: ?Goal: Risk for activity intolerance will decrease ?Outcome: Progressing ?  ?Problem: Nutrition: ?Goal: Adequate nutrition will be maintained ?Outcome: Progressing ?  ?Problem: Coping: ?Goal: Level of anxiety will decrease ?Outcome: Progressing ?  ?Problem: Elimination: ?Goal: Will not experience complications related to bowel motility ?Outcome: Progressing ?Goal: Will not experience complications related to urinary retention ?Outcome: Progressing ?  ?Problem: Pain Managment: ?Goal: General experience of comfort will improve ?Outcome: Progressing ?  ?Problem: Safety: ?Goal: Ability to remain free from injury will improve ?Outcome: Progressing ?  ?Problem: Skin Integrity: ?Goal: Risk for impaired skin integrity will decrease ?Outcome: Progressing ?  ?

## 2021-10-25 LAB — CBC
HCT: 27.3 % — ABNORMAL LOW (ref 36.0–46.0)
Hemoglobin: 9.1 g/dL — ABNORMAL LOW (ref 12.0–15.0)
MCH: 30.5 pg (ref 26.0–34.0)
MCHC: 33.3 g/dL (ref 30.0–36.0)
MCV: 91.6 fL (ref 80.0–100.0)
Platelets: 64 10*3/uL — ABNORMAL LOW (ref 150–400)
RBC: 2.98 MIL/uL — ABNORMAL LOW (ref 3.87–5.11)
RDW: 17.2 % — ABNORMAL HIGH (ref 11.5–15.5)
WBC: 24.5 10*3/uL — ABNORMAL HIGH (ref 4.0–10.5)
nRBC: 6.2 % — ABNORMAL HIGH (ref 0.0–0.2)

## 2021-10-25 MED ORDER — ACETAMINOPHEN 500 MG PO TABS
1000.0000 mg | ORAL_TABLET | Freq: Four times a day (QID) | ORAL | Status: DC | PRN
  Administered 2021-10-25 – 2021-10-26 (×2): 1000 mg via ORAL
  Filled 2021-10-25 (×2): qty 2

## 2021-10-25 MED ORDER — OXYCODONE HCL 5 MG PO TABS
5.0000 mg | ORAL_TABLET | ORAL | Status: DC | PRN
  Administered 2021-10-26: 5 mg via ORAL
  Filled 2021-10-25: qty 1

## 2021-10-25 NOTE — Progress Notes (Signed)
Post Partum Day 1 ?Subjective: ?up ad lib, voiding, tolerating PO, + flatus, and tired-resting well ? ?Objective: ?Blood pressure 124/84, pulse 77, temperature 97.8 ?F (36.6 ?C), temperature source Oral, resp. rate 18, height '5\' 3"'$  (1.6 m), weight 76.7 kg, last menstrual period 02/16/2021, SpO2 98 %, unknown if currently breastfeeding. ? ?Physical Exam:  ?General: alert, cooperative, and no distress ?Lochia: appropriate ?Uterine Fundus: firm ?Incision: healing well ?DVT Evaluation: No evidence of DVT seen on physical exam. ? ?Recent Labs  ?  10/24/21 ?0306 10/25/21 ?6578  ?HGB 9.8* 9.1*  ?HCT 31.2* 27.3*  ? ? 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 Risks reviewed. She states she understands and agrees. ? LOS: 4 days  ? ?Kathryn Allen ?10/25/2021, 7:12 AM  ? ? ?

## 2021-10-25 NOTE — Lactation Note (Signed)
This note was copied from a baby's chart. ?Lactation Consultation Note ? ?Patient Name: Kathryn Allen ?Today's Date: 10/25/2021 ?Reason for consult: Follow-up assessment;1st time breastfeeding;Primapara;Late-preterm 34-36.6wks;Breastfeeding assistance;Infant < 6lbs;Infant weight loss;Other (Comment);RN request (As LC entered the room, mom attempting to latch in the cross cradle. LC offered to assist, mom receptive. LC was able to latch the baby and he fed for 5 mins with swallows and released. nipple well rounded.) - Latch score 8  ?Age:32 hours ?LC showed mom and grand mother how to pace feed.  ?Baby would only take 1 ml with LC. LC allowed baby to rest for  a few minutes and  ?Baby more awake and rooting. [place] handed baby to mom and mom working on the pace feeding.  ?[place] recommends a speech consult for evaluation of the nipple . Baby is not consistent on the white standard nipple.  ? ?[place] reviewed the [place] plan for LPT , less than 5 pound infant.  ? ?Maternal Data ?Has patient been taught Hand Expression?: Yes ? ?Feeding ?Mother's Current Feeding Choice: Breast Milk and Donor Milk ?Nipple Type: Nfant Standard Flow (white) ? ?LATCH Score ?Latch: Grasps breast easily, tongue down, lips flanged, rhythmical sucking. ? ?Audible Swallowing: A few with stimulation ? ?Type of Nipple: Everted at rest and after stimulation ? ?Comfort (Breast/Nipple): Soft / non-tender ? ?Hold (Positioning): Assistance needed to correctly position infant at breast and maintain latch. ? ?LATCH Score: 8 ? ? ?Lactation Tools Discussed/Used ?Tools: Pump ?Flange Size: 24 ?Breast pump type: Double-Electric Breast Pump ?Pump Education: Milk Storage ?Reason for Pumping: LPT ? ?Interventions ?Interventions: Breast feeding basics reviewed;Assisted with latch;Skin to skin;Hand express;Breast massage;Reverse pressure;Breast compression;Adjust position;Support pillows;Position options;DEBP;Education ? ?Discharge ?  ? ?Consult Status ?Consult Status:  Follow-up ?Date: 10/26/21 ?Follow-up type: In-patient ? ? ? ?Jerlyn Ly  ?10/25/2021, 10:14 AM ? ? ? ?

## 2021-10-25 NOTE — Social Work (Signed)
CSW received consult for hx of Anxiety.  CSW met with MOB to offer support and complete assessment.   ? ?CSW met with MOB at bedside and introduced CSW role. CSW observed MOB in bed, infant asleep in the bassinet and maternal grandmother present. MOB gave CSW permission to share all information with her mom present. MOB presented pleasant and welcomed CSW visit. CSW inquired how MOB has felt since giving birth. MOB reported feeling tired and shared the L&D went smooth and easy. CSW inquired how MOB felt during the pregnancy. MOB reported feeling emotionally fine and exhausted towards the end with some anxiety about laboring early. CSW validated MOB concerns. MOB reported that she was diagnosed with anxiety about two years ago due to a situation that has since been resolved. MOB reported that she saw a therapist in the past and recently reached out to restart therapy services which she finds helpful. MOB reported she will see Rachel Parante at [facility] in a few weeks. MOB shared that sleeping also helps. MOB identified her mom and spouse as supports. CSW discussed PPD. CSW provided education regarding the baby blues period vs. perinatal mood disorders, discussed treatment and gave resources for mental health follow up if concerns arise.  CSW recommended MOB complete a self-evaluation during the postpartum time period using the New Mom Checklist from Postpartum Progress and encouraged MOB to contact a medical professional if symptoms are noted at any time. CSW assessed MOB for safety. MOB denied thoughts of harm to self and others.  ? ?MOB reported she has items for the infant including a bassinet where the infant will sleep. CSW provided review of Sudden Infant Death Syndrome (SIDS) precautions. MOB has chosen [facility] for the infant's follow up care. MOB reported that she is in the process of applying for WIC/ and SNAP benefits however she is not sure will meet income requirement. CSW  assessed MOB for additional needs. MOB reported no further need.  ? ?CSW identifies no further need for intervention and no barriers to discharge at this time.  ? ? , MSW, LCSW ?[facility]  ?Clinical Social Worker  ?[phone] ?10/25/2021  1:23 PM ?

## 2021-10-25 NOTE — Progress Notes (Signed)
Chatted with Dr. Mardelle Matte via secure Epic chat regarding patient's platelet level of 81 and the order for Ibuprofen '600mg'$  q6hr.  Patient c/o perineal pain 4/10, soreness, Tylenol given with little relief.  Okay to give Ibuprofen per Dr. Mardelle Matte.   ?

## 2021-10-26 LAB — SURGICAL PATHOLOGY

## 2021-10-26 LAB — CBC
HCT: 29.1 % — ABNORMAL LOW (ref 36.0–46.0)
Hemoglobin: 9.3 g/dL — ABNORMAL LOW (ref 12.0–15.0)
MCH: 30.2 pg (ref 26.0–34.0)
MCHC: 32 g/dL (ref 30.0–36.0)
MCV: 94.5 fL (ref 80.0–100.0)
Platelets: 58 10*3/uL — ABNORMAL LOW (ref 150–400)
RBC: 3.08 MIL/uL — ABNORMAL LOW (ref 3.87–5.11)
RDW: 17.6 % — ABNORMAL HIGH (ref 11.5–15.5)
WBC: 23.6 10*3/uL — ABNORMAL HIGH (ref 4.0–10.5)
nRBC: 11.7 % — ABNORMAL HIGH (ref 0.0–0.2)

## 2021-10-26 MED ORDER — OXYCODONE HCL 5 MG PO TABS: 5.0000 mg | ORAL_TABLET | ORAL | 0 refills | Status: DC | PRN

## 2021-10-26 NOTE — Social Work (Signed)
CSW received consult due to score 13 on Edinburgh Depression Screen.   ? ?CSW met with MOB at bedside on 5/4 and completed an assessment. CSW provided education regarding Baby Blues vs PMADs and provided MOB with resources for mental health follow up.  CSW encouraged MOB to evaluate her mental health throughout the postpartum period with the use of the New Mom Checklist developed by Postpartum Progress as well as the Edinburgh Postnatal Depression Scale and notify a medical professional if symptoms arise.    ? ? , MSW, LCSW ?[facility]  ?Clinical Social Worker  ?[phone] ?10/26/2021  8:11 AM  ?

## 2021-10-26 NOTE — Lactation Note (Signed)
This note was copied from a baby's chart. ?Lactation Consultation Note ? ?Patient Name: Kathryn Allen ?Today's Date: 10/26/2021 ?Reason for consult: Follow-up assessment;Late-preterm 34-36.6wks;Primapara;Infant < 6lbs;1st time breastfeeding;Difficult latch ?Age:32 hours ? ? ?P1 mother whose infant is now 59 hours old.  This is a late preterm infant at 35+5 weeks with a CGA of 36+0 weeks weighing < 6 lbs.  Mother's current feeding preference is breast/formula. Her long term preference will be to exclusively breast feed. ? ?Arrived to find SLP assisting with feeding "Zach."  She has changed "Zach" to the Dr. Saul Fordyce preemie nipple and stated that he is feeding much better with this bottle/nipple.  "[name]" was able to consume 25 mls in 20 minutes.   ? ?Collaborated with the SLP and feeding plan developed for today/tonight.  Mother will latch "[name]" every other feeding.  She will continue to supplement with goal volumes of 30+ mls.  Baby needs to be changed to 22 calorie formula instead of the 20 calorie formula he is currently consuming.  Mother will post pump for 15 minutes after every feeding session.  She verbalized frustration because she is not "getting anything."  Reassurance and emotional support provided.  Observed her pumping and assessed the #24 flange size to be appropriate at this time.  Allowed time for questions.  Reviewed the LPTI guidelines.  Grandmother is very involved and helpful with mother/baby.  She has written the feeding plan down on the LPTI sheet and we all reviewed together at the end of the consult.  Family appreciative. ? ?Encouraged family members to call for any further questions/concerns.  Mother has an electric pump for home use.  Offered to send an OP LC request to [facility] and mother interested.  Message sent.  Mother also aware of how to call after discharge for questions/concerns.  Grandfather in for a short visit and FOB will return later today.  Mother has good family  support. ? ? ?Maternal Data ?Has patient been taught Hand Expression?: Yes ?Does the patient have breastfeeding experience prior to this delivery?: No ? ?Feeding ?Mother's Current Feeding Choice: Breast Milk and Formula ?Nipple Type: Dr. Roosvelt Harps Preemie ? ?LATCH Score ?  ? ?  ? ?  ? ?  ? ?  ? ?  ? ? ?Lactation Tools Discussed/Used ?Tools: Pump;Flanges ?Flange Size: 24 ?Breast pump type: Double-Electric Breast Pump;Manual ?Pump Education: Setup, frequency, and cleaning (Reviewed) ?Reason for Pumping: LPTI; breast stimulation for supplementation ?Pumping frequency: Every three hours ?Pumped volume: 0 mL ? ?Interventions ?Interventions: Education ? ?Discharge ?Pump: Personal ? ?Consult Status ?Consult Status: Follow-up ?Date: 10/27/21 ?Follow-up type: In-patient ? ? ? ? R  ?10/26/2021, 1:51 PM ? ? ? ?

## 2021-10-26 NOTE — Discharge Summary (Signed)
? ?  Postpartum Discharge Summary ? ? ? ?   ?Patient Name: Kathryn Allen ?DOB: [birth year] ?MRN: [id number] ? ?Date of admission: 10/21/2021 ?Delivery date:10/24/2021  ?Delivering provider: Linda Hedges  ?Date of discharge: 10/26/2021 ? ?Admitting diagnosis: Active preterm labor [O60.10X0] ?Preterm premature rupture of membranes (PPROM) with unknown onset of labor [O42.919] ?Intrauterine pregnancy: [redacted]w[redacted]d    ?Secondary diagnosis:  Principal Problem: ?  Active preterm labor ?Active Problems: ?  Preterm premature rupture of membranes (PPROM) with unknown onset of labor ? ?Additional problems: ITP    ?Discharge diagnosis: Preterm Pregnancy Delivered                                              ?Post partum procedures:   ?Augmentation: N/A ?Complications: None ? ?Hospital course: Onset of Labor With Vaginal Delivery      ?31y.o. yo G1P0101 at 32w5das admitted with PPROM then labor on 10/21/2021. Patient had an uncomplicated labor course as follows:  ?Membrane Rupture Time/Date: 2:55 PM ,10/23/2021   ?Delivery Method:Vaginal, Spontaneous  ?Episiotomy: None  ?Lacerations:  2nd degree  ?Patient had an uncomplicated postpartum course.  She is ambulating, tolerating a regular diet, passing flatus, and urinating well. Patient is discharged home in stable condition on 10/26/21. ? ?Newborn Data: ?Birth date:10/24/2022  ?Birth time:1:14 AM  ?Gender:Female  ?Living status:Living  ?Apgars:7 ,9  ?Weight:2380 g  ? ?Magnesium Sulfate received: No ?BMZ received: Yes ?Rhophylac:N/A ?MMR:N/A ?T-DaP:Given prenatally ?Flu: N/A ?Transfusion:No ? ?Physical exam  ?Vitals:  ? 10/25/21 0604 10/25/21 1524 10/25/21 2355 10/26/21 [id number]?BP: 124/84 120/76 121/79 105/70  ?Pulse: 77 85 86 77  ?Resp: 18 17 18 18   ?Temp: 97.8 ?F (36.6 ?C) 97.8 ?F (36.6 ?C) 98.2 ?F (36.8 ?C) 98.9 ?F (37.2 ?C)  ?TempSrc: Oral Oral Oral Oral  ?SpO2: 98% 100% 100% 100%  ?Weight:      ?Height:      ? ?General: alert, cooperative, and no distress ?Lochia: appropriate ?Uterine  Fundus: firm ?Incision: N/A ?DVT Evaluation: No evidence of DVT seen on physical exam. ?Labs: ?Lab Results  ?Component Value Date  ? WBC 23.6 (H) 10/26/2021  ? HGB 9.3 (L) 10/26/2021  ? HCT 29.1 (L) 10/26/2021  ? MCV 94.5 10/26/2021  ? PLT 58 (L) 10/26/2021  ? ? ?  Latest Ref Rng & Units 03/27/2021  ?  9:29 AM  ?CMP  ?Glucose 70 - 99 mg/dL 86    ?BUN 6 - 20 mg/dL 10    ?Creatinine 0.57 - 1.00 mg/dL 0.87    ?Sodium 134 - 144 mmol/L 133    ?Potassium 3.5 - 5.2 mmol/L 4.2    ?Chloride 96 - 106 mmol/L 100    ?CO2 20 - 29 mmol/L 19    ?Calcium 8.7 - 10.2 mg/dL 9.7    ?Total Protein 6.0 - 8.5 g/dL 7.3    ?Total Bilirubin 0.0 - 1.2 mg/dL 0.3    ?Alkaline Phos 44 - 121 IU/L 54    ?AST 0 - 40 IU/L 16    ?ALT 0 - 32 IU/L 15    ? ?Edinburgh Score: ? ?  10/25/2021  ?  3:27 PM  ?Edinburgh Postnatal Depression Scale Screening Tool  ?I have been able to laugh and see the funny side of things. 1  ?I have looked forward with enjoyment to things. 2  ?I have blamed  myself unnecessarily when things went wrong. 2  ?I have been anxious or worried for no good reason. 1  ?I have felt scared or panicky for no good reason. 1  ?Things have been getting on top of me. 2  ?I have been so unhappy that I have had difficulty sleeping. 2  ?I have felt sad or miserable. 2  ?I have been so unhappy that I have been crying. 0  ?The thought of harming myself has occurred to me. 0  ?Edinburgh Postnatal Depression Scale Total 13  ? ? ? ? ?After visit meds:  ?Allergies as of 10/26/2021   ? ?   Reactions  ? Shellfish Allergy Hives  ? ?  ? ?  ?Medication List  ?  ? ?STOP taking these medications   ? ?Benadryl Allergy 25 MG tablet ?Generic drug: diphenhydrAMINE ?  ?calcium carbonate 500 MG chewable tablet ?Commonly known as: TUMS - dosed in mg elemental calcium ?  ?cetirizine 10 MG tablet ?Commonly known as: ZYRTEC ?  ?ferrous sulfate 325 (65 FE) MG tablet ?  ?polyethylene glycol 17 g packet ?Commonly known as: MIRALAX / GLYCOLAX ?  ?PRENATAL PLUS PO ?  ?psyllium  58.6 % packet ?Commonly known as: METAMUCIL ?  ? ?  ? ?TAKE these medications   ? ?oxyCODONE 5 MG immediate release tablet ?Commonly known as: Oxy IR/ROXICODONE ?Take 1 tablet (5 mg total) by mouth every 4 (four) hours as needed for moderate pain. ?  ? ?  ? ? ? ?Discharge home in stable condition ?Infant Feeding: Breast ?Infant Disposition:rooming in ?Discharge instruction: per After Visit Summary and Postpartum booklet. ?Activity: Advance as tolerated. Pelvic rest for 6 weeks.  ?Diet: routine diet ?Anticipated Birth Control: Unsure ?Postpartum Appointment:1 week ?Additional Postpartum F/U:  CBC check next week ?Future Appointments:No future appointments. ?Follow up Visit: ? ? ?  ? ?10/26/2021 ?Luz Lex, MD ? ? ?

## 2021-10-27 ENCOUNTER — Ambulatory Visit: Payer: Self-pay

## 2021-10-27 NOTE — Lactation Note (Signed)
This note was copied from a baby's chart. ?Lactation Consultation Note ?Mom wanted to try to latch. Mom stated baby doesn't do well latching. ?[place] placed baby in football hold. Taught mom how to use nipple to top lip to get baby to open mouth. Mom got FOB to video baby so she will remember how to latch in football position. ?Baby had little feeding to the breast. Mainly looking around. Needed much stimulation to suckle. Kept reminding mom baby needs a little break during feeding. Its normal to suckle and stop. Give baby 10-15 sec. To rest the stroke cheek, and or talk to baby to get him to suckle again. ?Looked like non-nutritive feeding at the breast. Baby wasn't that hungry on the bottle. Encouraged pace feeding.  ?Will report to RN of consult. ? ?Patient Name: Kathryn Allen ?Today's Date: 10/27/2021 ?Reason for consult: Follow-up assessment;Infant < 6lbs;Late-preterm 34-36.6wks ?Age:10 hours ? ?Maternal Data ?  ? ?Feeding ?Mother's Current Feeding Choice: Breast Milk and Formula ? ?LATCH Score ?Latch: Repeated attempts needed to sustain latch, nipple held in mouth throughout feeding, stimulation needed to elicit sucking reflex. ? ?Audible Swallowing: None ? ?Type of Nipple: Everted at rest and after stimulation ? ?Comfort (Breast/Nipple): Soft / non-tender ? ?Hold (Positioning): Full assist, staff holds infant at breast ? ?LATCH Score: 5 ? ? ?Lactation Tools Discussed/Used ?  ? ?Interventions ?Interventions: Breast feeding basics reviewed;Assisted with latch;Skin to skin;Breast massage;Breast compression;Adjust position;Support pillows;Position options ? ?Discharge ?  ? ?Consult Status ?Consult Status: Follow-up ?Date: 10/27/21 ?Follow-up type: In-patient ? ? ? ?Theodoro Kalata ?10/27/2021, 2:39 AM ? ? ? ?

## 2021-10-28 ENCOUNTER — Ambulatory Visit: Payer: Self-pay

## 2021-10-28 NOTE — Lactation Note (Signed)
This note was copied from a baby's chart. ?Lactation Consultation Note ? ?Patient Name: Kathryn Allen ?Today's Date: 10/28/2021 ?Reason for consult: Follow-up assessment;Late-preterm 34-36.6wks;Primapara;1st time breastfeeding ?Age:32 days ? ? ?P1 mother whose infant is now 57 days old.  This is a late preterm baby born at 36+5 weeks with a CGA of 36+2 weeks.  Mother's current feeding preference is breast/formula. ? ?Met family in their room as they were waiting for transport.  Reviewed mother's plan for after discharge.  She will continue to latch, supplement and post pump.  Mother still unable to obtain more than drops; encouragement given.  "Kathryn Allen" has been feeding much better after his SLP consults and is taking 30+ mls/feeding session.  Praised parents for their efforts with bottle feeding. ? ?Epic message sent to [facility] on Friday (2 days ago) for an OP consult; mother still interested.  Asked mother to call them if she has not received a phone call by tomorrow.  Mother interested and has the number.  Father present.  Tech present to walk family out. ? ? ?Maternal Data ?  ? ?Feeding ?Mother's Current Feeding Choice: Breast Milk and Formula ? ?LATCH Score ?  ? ?  ? ?  ? ?  ? ?  ? ?  ? ? ?Lactation Tools Discussed/Used ?  ? ?Interventions ?Interventions: Education ? ?Discharge ?Discharge Education: Outpatient recommendation;Outpatient Epic message sent (Message sent to [place] on Friday) ?Pump: Personal ? ?Consult Status ?Consult Status: Complete ?Date: 10/28/21 ?Follow-up type: Call as needed ? ? ? ? R  ?10/28/2021, 12:06 PM ? ? ? ?

## 2021-11-01 ENCOUNTER — Telehealth (HOSPITAL_COMMUNITY): Payer: Self-pay | Admitting: *Deleted

## 2021-11-01 NOTE — Telephone Encounter (Signed)
Left phone voicemail message. ? ?Odis Hollingshead, RN 11-01-2021 at 11:56am ?

## 2021-11-23 ENCOUNTER — Inpatient Hospital Stay (HOSPITAL_COMMUNITY)
Admission: AD | Admit: 2021-11-23 | Payer: Managed Care, Other (non HMO) | Source: Home / Self Care | Admitting: Obstetrics & Gynecology

## 2021-11-28 ENCOUNTER — Other Ambulatory Visit: Payer: Self-pay | Admitting: Obstetrics and Gynecology

## 2021-11-28 DIAGNOSIS — R2231 Localized swelling, mass and lump, right upper limb: Secondary | ICD-10-CM

## 2021-11-28 DIAGNOSIS — N6320 Unspecified lump in the left breast, unspecified quadrant: Secondary | ICD-10-CM

## 2021-11-28 DIAGNOSIS — R2232 Localized swelling, mass and lump, left upper limb: Secondary | ICD-10-CM

## 2021-12-13 ENCOUNTER — Other Ambulatory Visit: Payer: Self-pay | Admitting: Obstetrics and Gynecology

## 2021-12-13 ENCOUNTER — Ambulatory Visit
Admission: RE | Admit: 2021-12-13 | Discharge: 2021-12-13 | Disposition: A | Discharge status: Home / Self Care | Payer: Managed Care, Other (non HMO) | Source: Ambulatory Visit | Attending: Obstetrics and Gynecology | Admitting: Obstetrics and Gynecology

## 2021-12-13 DIAGNOSIS — R2232 Localized swelling, mass and lump, left upper limb: Secondary | ICD-10-CM

## 2021-12-13 DIAGNOSIS — R2231 Localized swelling, mass and lump, right upper limb: Secondary | ICD-10-CM

## 2021-12-13 DIAGNOSIS — N6320 Unspecified lump in the left breast, unspecified quadrant: Secondary | ICD-10-CM

## 2022-07-03 LAB — OB RESULTS CONSOLE RUBELLA ANTIBODY, IGM: Rubella: IMMUNE

## 2022-07-03 LAB — OB RESULTS CONSOLE HEPATITIS B SURFACE ANTIGEN: Hepatitis B Surface Ag: NEGATIVE

## 2022-07-03 LAB — OB RESULTS CONSOLE HIV ANTIBODY (ROUTINE TESTING): HIV: NONREACTIVE

## 2022-07-18 LAB — OB RESULTS CONSOLE GC/CHLAMYDIA
Chlamydia: NEGATIVE
Neisseria Gonorrhea: NEGATIVE

## 2023-01-15 LAB — OB RESULTS CONSOLE GBS: GBS: NEGATIVE

## 2023-02-03 ENCOUNTER — Inpatient Hospital Stay (HOSPITAL_COMMUNITY): Payer: Managed Care, Other (non HMO) | Admitting: Anesthesiology

## 2023-02-03 ENCOUNTER — Encounter (HOSPITAL_COMMUNITY): Payer: Self-pay

## 2023-02-03 ENCOUNTER — Inpatient Hospital Stay (HOSPITAL_COMMUNITY)
Admission: AD | Admit: 2023-02-03 | Discharge: 2023-02-06 | DRG: 786 | Disposition: A | Discharge status: Home / Self Care | Payer: Managed Care, Other (non HMO) | Attending: Obstetrics and Gynecology | Admitting: Obstetrics and Gynecology

## 2023-02-03 ENCOUNTER — Encounter (HOSPITAL_COMMUNITY)
Admission: AD | Disposition: A | Discharge status: Home / Self Care | Payer: Self-pay | Source: Home / Self Care | Attending: Obstetrics and Gynecology

## 2023-02-03 ENCOUNTER — Other Ambulatory Visit: Payer: Self-pay

## 2023-02-03 DIAGNOSIS — K831 Obstruction of bile duct: Secondary | ICD-10-CM | POA: Diagnosis present

## 2023-02-03 DIAGNOSIS — O26643 Intrahepatic cholestasis of pregnancy, third trimester: Secondary | ICD-10-CM | POA: Diagnosis present

## 2023-02-03 DIAGNOSIS — O99893 Other specified diseases and conditions complicating puerperium: Secondary | ICD-10-CM | POA: Diagnosis not present

## 2023-02-03 DIAGNOSIS — R7989 Other specified abnormal findings of blood chemistry: Secondary | ICD-10-CM | POA: Diagnosis not present

## 2023-02-03 DIAGNOSIS — L299 Pruritus, unspecified: Secondary | ICD-10-CM | POA: Diagnosis not present

## 2023-02-03 DIAGNOSIS — Z3A39 39 weeks gestation of pregnancy: Secondary | ICD-10-CM

## 2023-02-03 DIAGNOSIS — O26893 Other specified pregnancy related conditions, third trimester: Secondary | ICD-10-CM | POA: Diagnosis present

## 2023-02-03 DIAGNOSIS — Z98891 History of uterine scar from previous surgery: Secondary | ICD-10-CM

## 2023-02-03 LAB — TYPE AND SCREEN
ABO/RH(D): O POS
Antibody Screen: NEGATIVE

## 2023-02-03 LAB — CBC
HCT: 35.3 % — ABNORMAL LOW (ref 36.0–46.0)
Hemoglobin: 11.3 g/dL — ABNORMAL LOW (ref 12.0–15.0)
MCH: 29.4 pg (ref 26.0–34.0)
MCHC: 32 g/dL (ref 30.0–36.0)
MCV: 91.9 fL (ref 80.0–100.0)
Platelets: 161 10*3/uL (ref 150–400)
RBC: 3.84 MIL/uL — ABNORMAL LOW (ref 3.87–5.11)
RDW: 15.8 % — ABNORMAL HIGH (ref 11.5–15.5)
WBC: 10.9 10*3/uL — ABNORMAL HIGH (ref 4.0–10.5)
nRBC: 0.3 % — ABNORMAL HIGH (ref 0.0–0.2)

## 2023-02-03 LAB — RPR: RPR Ser Ql: NONREACTIVE

## 2023-02-03 SURGERY — Surgical Case
Anesthesia: Epidural

## 2023-02-03 MED ORDER — OXYTOCIN-SODIUM CHLORIDE 30-0.9 UT/500ML-% IV SOLN
1.0000 m[IU]/min | INTRAVENOUS | Status: DC
  Administered 2023-02-03: 2 m[IU]/min via INTRAVENOUS
  Filled 2023-02-03: qty 500

## 2023-02-03 MED ORDER — SODIUM BICARBONATE 8.4 % IV SOLN
INTRAVENOUS | Status: DC | PRN
  Administered 2023-02-03: 5 mL via EPIDURAL
  Administered 2023-02-03: 2 mL via EPIDURAL
  Administered 2023-02-03: 5 mL via EPIDURAL

## 2023-02-03 MED ORDER — OXYCODONE-ACETAMINOPHEN 5-325 MG PO TABS: 1.0000 | ORAL_TABLET | ORAL | Status: DC | PRN

## 2023-02-03 MED ORDER — SIMETHICONE 80 MG PO CHEW: 80.0000 mg | CHEWABLE_TABLET | ORAL | Status: DC | PRN

## 2023-02-03 MED ORDER — COCONUT OIL OIL: 1.0000 | TOPICAL_OIL | Status: DC | PRN

## 2023-02-03 MED ORDER — SCOPOLAMINE 1 MG/3DAYS TD PT72
MEDICATED_PATCH | TRANSDERMAL | Status: AC
  Filled 2023-02-03: qty 1

## 2023-02-03 MED ORDER — ACETAMINOPHEN 10 MG/ML IV SOLN
INTRAVENOUS | Status: DC | PRN
Start: 2023-02-03 — End: 2023-02-03
  Administered 2023-02-03: 1000 mg via INTRAVENOUS

## 2023-02-03 MED ORDER — FENTANYL CITRATE (PF) 100 MCG/2ML IJ SOLN: 25.0000 ug | INTRAMUSCULAR | Status: DC | PRN

## 2023-02-03 MED ORDER — ONDANSETRON HCL 4 MG/2ML IJ SOLN
4.0000 mg | Freq: Four times a day (QID) | INTRAMUSCULAR | Status: DC | PRN
  Administered 2023-02-03: 4 mg via INTRAVENOUS

## 2023-02-03 MED ORDER — STERILE WATER FOR IRRIGATION IR SOLN
Status: DC | PRN
  Administered 2023-02-03: 1000 mL

## 2023-02-03 MED ORDER — PHENYLEPHRINE 80 MCG/ML (10ML) SYRINGE FOR IV PUSH (FOR BLOOD PRESSURE SUPPORT): 80.0000 ug | PREFILLED_SYRINGE | INTRAVENOUS | Status: DC | PRN

## 2023-02-03 MED ORDER — SCOPOLAMINE 1 MG/3DAYS TD PT72
1.0000 | MEDICATED_PATCH | Freq: Once | TRANSDERMAL | Status: AC
  Administered 2023-02-03: 1.5 mg via TRANSDERMAL

## 2023-02-03 MED ORDER — TERBUTALINE SULFATE 1 MG/ML IJ SOLN: 0.2500 mg | Freq: Once | INTRAMUSCULAR | Status: DC | PRN

## 2023-02-03 MED ORDER — KETOROLAC TROMETHAMINE 30 MG/ML IJ SOLN
INTRAMUSCULAR | Status: AC
  Filled 2023-02-03: qty 1

## 2023-02-03 MED ORDER — EPHEDRINE 5 MG/ML INJ: 10.0000 mg | INTRAVENOUS | Status: DC | PRN

## 2023-02-03 MED ORDER — FLEET ENEMA 7-19 GM/118ML RE ENEM: 1.0000 | ENEMA | RECTAL | Status: DC | PRN

## 2023-02-03 MED ORDER — FENTANYL-BUPIVACAINE-NACL 0.5-0.125-0.9 MG/250ML-% EP SOLN
EPIDURAL | Status: DC | PRN
  Administered 2023-02-03: 12 mL/h via EPIDURAL

## 2023-02-03 MED ORDER — SODIUM CHLORIDE 0.9 % IR SOLN
Status: DC | PRN
  Administered 2023-02-03: 1

## 2023-02-03 MED ORDER — OXYTOCIN-SODIUM CHLORIDE 30-0.9 UT/500ML-% IV SOLN
INTRAVENOUS | Status: AC
  Filled 2023-02-03: qty 500

## 2023-02-03 MED ORDER — OXYCODONE HCL 5 MG PO TABS: 5.0000 mg | ORAL_TABLET | ORAL | Status: DC | PRN

## 2023-02-03 MED ORDER — SOD CITRATE-CITRIC ACID 500-334 MG/5ML PO SOLN
30.0000 mL | ORAL | Status: DC | PRN
  Administered 2023-02-03 (×2): 30 mL via ORAL
  Filled 2023-02-03 (×2): qty 30

## 2023-02-03 MED ORDER — SIMETHICONE 80 MG PO CHEW
80.0000 mg | CHEWABLE_TABLET | Freq: Three times a day (TID) | ORAL | Status: DC
  Administered 2023-02-04 – 2023-02-06 (×7): 80 mg via ORAL
  Filled 2023-02-03 (×7): qty 1

## 2023-02-03 MED ORDER — CEFAZOLIN SODIUM-DEXTROSE 2-3 GM-%(50ML) IV SOLR
INTRAVENOUS | Status: DC | PRN
  Administered 2023-02-03: 2 g via INTRAVENOUS

## 2023-02-03 MED ORDER — MORPHINE SULFATE (PF) 0.5 MG/ML IJ SOLN
INTRAMUSCULAR | Status: AC
  Filled 2023-02-03: qty 10

## 2023-02-03 MED ORDER — OXYTOCIN BOLUS FROM INFUSION: 333.0000 mL | Freq: Once | INTRAVENOUS | Status: DC

## 2023-02-03 MED ORDER — IBUPROFEN 600 MG PO TABS
600.0000 mg | ORAL_TABLET | Freq: Four times a day (QID) | ORAL | Status: DC
  Administered 2023-02-03 – 2023-02-06 (×9): 600 mg via ORAL
  Filled 2023-02-03 (×10): qty 1

## 2023-02-03 MED ORDER — OXYCODONE-ACETAMINOPHEN 5-325 MG PO TABS: 2.0000 | ORAL_TABLET | ORAL | Status: DC | PRN

## 2023-02-03 MED ORDER — LACTATED RINGERS IV SOLN
INTRAVENOUS | Status: DC
  Administered 2023-02-03: 1000 mL via INTRAVENOUS

## 2023-02-03 MED ORDER — NALOXONE HCL 0.4 MG/ML IJ SOLN: 0.4000 mg | INTRAMUSCULAR | Status: DC | PRN

## 2023-02-03 MED ORDER — OXYTOCIN-SODIUM CHLORIDE 30-0.9 UT/500ML-% IV SOLN: 2.5000 [IU]/h | INTRAVENOUS | Status: AC

## 2023-02-03 MED ORDER — KETOROLAC TROMETHAMINE 30 MG/ML IJ SOLN
30.0000 mg | Freq: Four times a day (QID) | INTRAMUSCULAR | Status: AC | PRN
  Administered 2023-02-03: 30 mg via INTRAVENOUS

## 2023-02-03 MED ORDER — NALOXONE HCL 4 MG/10ML IJ SOLN: 1.0000 ug/kg/h | INTRAVENOUS | Status: DC | PRN

## 2023-02-03 MED ORDER — ZOLPIDEM TARTRATE 5 MG PO TABS: 5.0000 mg | ORAL_TABLET | Freq: Every evening | ORAL | Status: DC | PRN

## 2023-02-03 MED ORDER — FENTANYL CITRATE (PF) 100 MCG/2ML IJ SOLN
INTRAMUSCULAR | Status: AC
  Filled 2023-02-03: qty 2

## 2023-02-03 MED ORDER — FENTANYL CITRATE (PF) 100 MCG/2ML IJ SOLN
INTRAMUSCULAR | Status: DC | PRN
  Administered 2023-02-03: 100 ug via EPIDURAL

## 2023-02-03 MED ORDER — DEXAMETHASONE SODIUM PHOSPHATE 4 MG/ML IJ SOLN
INTRAMUSCULAR | Status: AC
  Filled 2023-02-03: qty 1

## 2023-02-03 MED ORDER — DIPHENHYDRAMINE HCL 25 MG PO CAPS: 25.0000 mg | ORAL_CAPSULE | Freq: Four times a day (QID) | ORAL | Status: DC | PRN

## 2023-02-03 MED ORDER — SODIUM CHLORIDE 0.9% FLUSH: 3.0000 mL | INTRAVENOUS | Status: DC | PRN

## 2023-02-03 MED ORDER — MENTHOL 3 MG MT LOZG: 1.0000 | LOZENGE | OROMUCOSAL | Status: DC | PRN

## 2023-02-03 MED ORDER — ACETAMINOPHEN 10 MG/ML IV SOLN
INTRAVENOUS | Status: AC
  Filled 2023-02-03: qty 100

## 2023-02-03 MED ORDER — DIPHENHYDRAMINE HCL 50 MG/ML IJ SOLN
12.5000 mg | INTRAMUSCULAR | Status: DC | PRN
  Administered 2023-02-04 (×2): 12.5 mg via INTRAVENOUS
  Filled 2023-02-03 (×2): qty 1

## 2023-02-03 MED ORDER — MORPHINE SULFATE (PF) 0.5 MG/ML IJ SOLN
INTRAMUSCULAR | Status: DC | PRN
  Administered 2023-02-03: 3 mg via EPIDURAL

## 2023-02-03 MED ORDER — ONDANSETRON HCL 4 MG/2ML IJ SOLN: 4.0000 mg | Freq: Three times a day (TID) | INTRAMUSCULAR | Status: DC | PRN

## 2023-02-03 MED ORDER — PRENATAL MULTIVITAMIN CH
1.0000 | ORAL_TABLET | Freq: Every day | ORAL | Status: DC
  Administered 2023-02-04 – 2023-02-06 (×3): 1 via ORAL
  Filled 2023-02-03 (×3): qty 1

## 2023-02-03 MED ORDER — ONDANSETRON HCL 4 MG/2ML IJ SOLN
INTRAMUSCULAR | Status: AC
  Filled 2023-02-03: qty 2

## 2023-02-03 MED ORDER — DEXAMETHASONE SODIUM PHOSPHATE 10 MG/ML IJ SOLN
INTRAMUSCULAR | Status: AC
  Filled 2023-02-03: qty 1

## 2023-02-03 MED ORDER — OXYTOCIN-SODIUM CHLORIDE 30-0.9 UT/500ML-% IV SOLN
2.5000 [IU]/h | INTRAVENOUS | Status: DC
  Administered 2023-02-03: 300 [IU] via INTRAVENOUS

## 2023-02-03 MED ORDER — LACTATED RINGERS IV SOLN
500.0000 mL | Freq: Once | INTRAVENOUS | Status: AC
  Administered 2023-02-03: 500 mL via INTRAVENOUS

## 2023-02-03 MED ORDER — SENNOSIDES-DOCUSATE SODIUM 8.6-50 MG PO TABS
2.0000 | ORAL_TABLET | Freq: Every day | ORAL | Status: DC
  Administered 2023-02-04 – 2023-02-06 (×3): 2 via ORAL
  Filled 2023-02-03 (×3): qty 2

## 2023-02-03 MED ORDER — ACETAMINOPHEN 325 MG PO TABS
650.0000 mg | ORAL_TABLET | ORAL | Status: DC | PRN
  Administered 2023-02-05 – 2023-02-06 (×3): 650 mg via ORAL
  Filled 2023-02-03 (×3): qty 2

## 2023-02-03 MED ORDER — LACTATED RINGERS IV SOLN: INTRAVENOUS | Status: DC | PRN

## 2023-02-03 MED ORDER — DIPHENHYDRAMINE HCL 25 MG PO CAPS
25.0000 mg | ORAL_CAPSULE | ORAL | Status: DC | PRN
  Administered 2023-02-04 (×2): 25 mg via ORAL
  Filled 2023-02-03 (×2): qty 1

## 2023-02-03 MED ORDER — ACETAMINOPHEN 325 MG PO TABS: 650.0000 mg | ORAL_TABLET | ORAL | Status: DC | PRN

## 2023-02-03 MED ORDER — ACETAMINOPHEN 500 MG PO TABS
1000.0000 mg | ORAL_TABLET | Freq: Four times a day (QID) | ORAL | Status: AC
  Administered 2023-02-03 – 2023-02-04 (×3): 1000 mg via ORAL
  Filled 2023-02-03 (×3): qty 2

## 2023-02-03 MED ORDER — FENTANYL-BUPIVACAINE-NACL 0.5-0.125-0.9 MG/250ML-% EP SOLN
12.0000 mL/h | EPIDURAL | Status: DC | PRN
  Filled 2023-02-03: qty 250

## 2023-02-03 MED ORDER — DIPHENHYDRAMINE HCL 50 MG/ML IJ SOLN: 12.5000 mg | INTRAMUSCULAR | Status: DC | PRN

## 2023-02-03 MED ORDER — DEXAMETHASONE SODIUM PHOSPHATE 10 MG/ML IJ SOLN
INTRAMUSCULAR | Status: DC | PRN
  Administered 2023-02-03: 5 mg via INTRAVENOUS

## 2023-02-03 MED ORDER — LIDOCAINE HCL (PF) 1 % IJ SOLN
INTRAMUSCULAR | Status: DC | PRN
  Administered 2023-02-03: 5 mL via EPIDURAL

## 2023-02-03 MED ORDER — LIDOCAINE-EPINEPHRINE (PF) 2 %-1:200000 IJ SOLN
INTRAMUSCULAR | Status: AC
  Filled 2023-02-03: qty 20

## 2023-02-03 MED ORDER — LACTATED RINGERS IV SOLN
500.0000 mL | INTRAVENOUS | Status: DC | PRN
  Administered 2023-02-03 (×2): 500 mL via INTRAVENOUS

## 2023-02-03 MED ORDER — LIDOCAINE HCL (PF) 1 % IJ SOLN: 30.0000 mL | INTRAMUSCULAR | Status: DC | PRN

## 2023-02-03 MED ORDER — KETOROLAC TROMETHAMINE 30 MG/ML IJ SOLN: 30.0000 mg | Freq: Four times a day (QID) | INTRAMUSCULAR | Status: AC | PRN

## 2023-02-03 SURGICAL SUPPLY — 33 items
APL PRP STRL LF DISP 70% ISPRP (MISCELLANEOUS) ×2
APL SKNCLS STERI-STRIP NONHPOA (GAUZE/BANDAGES/DRESSINGS) ×2
BARRIER ADHS 3X4 INTERCEED (GAUZE/BANDAGES/DRESSINGS) IMPLANT
BENZOIN TINCTURE PRP APPL 2/3 (GAUZE/BANDAGES/DRESSINGS) IMPLANT
BRR ADH 4X3 ABS CNTRL BYND (GAUZE/BANDAGES/DRESSINGS)
CHLORAPREP W/TINT 26 (MISCELLANEOUS) ×2 IMPLANT
CLAMP UMBILICAL CORD (MISCELLANEOUS) ×1 IMPLANT
CLOTH BEACON ORANGE TIMEOUT ST (SAFETY) ×1 IMPLANT
DRSG OPSITE POSTOP 4X10 (GAUZE/BANDAGES/DRESSINGS) ×1 IMPLANT
ELECT REM PT RETURN 9FT ADLT (ELECTROSURGICAL) ×1
ELECTRODE REM PT RTRN 9FT ADLT (ELECTROSURGICAL) ×1 IMPLANT
EXTRACTOR VACUUM M CUP 4 TUBE (SUCTIONS) IMPLANT
GLOVE BIO SURGEON STRL SZ 6.5 (GLOVE) ×1 IMPLANT
GLOVE BIOGEL PI IND STRL 7.0 (GLOVE) ×1 IMPLANT
GOWN STRL REUS W/TWL LRG LVL3 (GOWN DISPOSABLE) ×2 IMPLANT
KIT ABG SYR 3ML LUER SLIP (SYRINGE) IMPLANT
NDL HYPO 25X5/8 SAFETYGLIDE (NEEDLE) ×1 IMPLANT
NEEDLE HYPO 22GX1.5 SAFETY (NEEDLE) IMPLANT
NEEDLE HYPO 25X5/8 SAFETYGLIDE (NEEDLE) ×1 IMPLANT
NS IRRIG 1000ML POUR BTL (IV SOLUTION) ×1 IMPLANT
PACK C SECTION WH (CUSTOM PROCEDURE TRAY) ×1 IMPLANT
PAD OB MATERNITY 4.3X12.25 (PERSONAL CARE ITEMS) ×1 IMPLANT
SUT CHROMIC 0 CTX 36 (SUTURE) ×2 IMPLANT
SUT PLAIN 0 NONE (SUTURE) IMPLANT
SUT PLAIN 2 0 XLH (SUTURE) IMPLANT
SUT VIC AB 0 CT1 27 (SUTURE) ×5
SUT VIC AB 0 CT1 27XBRD ANBCTR (SUTURE) ×3 IMPLANT
SUT VIC AB 4-0 KS 27 (SUTURE) IMPLANT
SYR CONTROL 10ML LL (SYRINGE) IMPLANT
TOWEL OR 17X24 6PK STRL BLUE (TOWEL DISPOSABLE) ×1 IMPLANT
TRAY FOLEY W/BAG SLVR 14FR LF (SET/KITS/TRAYS/PACK) ×1 IMPLANT
VACUUM CUP M-STYLE MYSTIC II (SUCTIONS) IMPLANT
WATER STERILE IRR 1000ML POUR (IV SOLUTION) ×1 IMPLANT

## 2023-02-03 NOTE — Anesthesia Procedure Notes (Signed)
Epidural Patient location during procedure: OB Start time: 02/03/2023 4:11 AM End time: 02/03/2023 4:24 AM  Staffing Anesthesiologist: Trevor Iha, MD Performed: anesthesiologist   Preanesthetic Checklist Completed: patient identified, IV checked, site marked, risks and benefits discussed, surgical consent, monitors and equipment checked, pre-op evaluation and timeout performed  Epidural Patient position: sitting Prep: DuraPrep and site prepped and draped Patient monitoring: continuous pulse ox and blood pressure Approach: midline Location: L3-L4 Injection technique: LOR air  Needle:  Needle type: Tuohy  Needle gauge: 17 G Needle length: 9 cm and 9 Needle insertion depth: 7 cm Catheter type: closed end flexible Catheter size: 19 Gauge Catheter at skin depth: 13 cm Test dose: negative  Assessment Events: blood not aspirated, no cerebrospinal fluid, injection not painful, no injection resistance, no paresthesia and negative IV test  Additional Notes Patient identified. Risks/Benefits/Options discussed with patient including but not limited to bleeding, infection, nerve damage, paralysis, failed block, incomplete pain control, headache, blood pressure changes, nausea, vomiting, reactions to medication both or allergic, itching and postpartum back pain. Confirmed with bedside nurse the patient's most recent platelet count. Confirmed with patient that they are not currently taking any anticoagulation, have any bleeding history or any family history of bleeding disorders. Patient expressed understanding and wished to proceed. All questions were answered. Sterile technique was used throughout the entire procedure. Please see nursing notes for vital signs. Test dose was given through epidural needle and negative prior to continuing to dose epidural or start infusion. Warning signs of high block given to the patient including shortness of breath, tingling/numbness in hands, complete  motor block, or any concerning symptoms with instructions to call for help. Patient was given instructions on fall risk and not to get out of bed. All questions and concerns addressed with instructions to call with any issues. 1 Attempt (S) . Patient tolerated procedure well.

## 2023-02-03 NOTE — Anesthesia Preprocedure Evaluation (Addendum)
Anesthesia Evaluation  Patient identified by MRN, date of birth, ID band Patient awake    Reviewed: Allergy & Precautions, NPO status , Patient's Chart, lab work & pertinent test results  Airway Mallampati: II  TM Distance: >3 FB Neck ROM: Full    Dental no notable dental hx. (+) Teeth Intact, Dental Advisory Given   Pulmonary    Pulmonary exam normal breath sounds clear to auscultation       Cardiovascular negative cardio ROS Normal cardiovascular exam Rhythm:Regular Rate:Normal     Neuro/Psych  negative psych ROS   GI/Hepatic negative GI ROS, Neg liver ROS,,,  Endo/Other    Renal/GU negative Renal ROS     Musculoskeletal   Abdominal   Peds  Hematology Lab Results      Component                Value               Date                      WBC                      10.9 (H)            02/03/2023                HGB                      11.3 (L)            02/03/2023                HCT                      35.3 (L)            02/03/2023                MCV                      91.9                02/03/2023                PLT                      161                 02/03/2023              Anesthesia Other Findings   Reproductive/Obstetrics (+) Pregnancy                             Anesthesia Physical Anesthesia Plan  ASA: 2  Anesthesia Plan: Epidural   Post-op Pain Management:    Induction:   PONV Risk Score and Plan:   Airway Management Planned:   Additional Equipment:   Intra-op Plan:   Post-operative Plan:   Informed Consent: I have reviewed the patients History and Physical, chart, labs and discussed the procedure including the risks, benefits and alternatives for the proposed anesthesia with the patient or authorized representative who has indicated his/her understanding and acceptance.       Plan Discussed with:   Anesthesia Plan Comments: (39.1wkG2P1 for LEA)        Anesthesia Quick Evaluation

## 2023-02-03 NOTE — Lactation Note (Signed)
This note was copied from a baby's chart. Lactation Consultation Note  Patient Name: Kathryn Allen [id number] Date: 02/03/2023 Age:33 hours Reason for consult: Initial assessment;Term LC entered the room, infant was cuing to breastfeed, LC gave Birth Parent pillow support and work with aligning infant parallel to breast, chest to breast using the football hold and flanging infant's lips outwards due Birth Parent feeling a pinch with latch, infant was removed multiples times as we worked on Building control surveyor with depth. Infant was still breastfeeding after 15 minutes when LC left the room. Birth Parent will continue to work on infant's latch, BF infant according to hunger cues, on demand, 8 to 12+ times within 24 hours, skin to skin. Birth Parent knows if she feels pain, discomfort or pinching to break latch and re-latch infant to breast. Birth Parent knows to call RN//LC for further latch assistance if needed. LC discussed infant's input and output within 1st 24 hours of life. Importance of maternal rest, diet and hydration. See maternal data below 1st child was LPTI and she had latch difficulties was seen [facility] outpatient clinic for latch assistance after discharge. LC discussed that each breastfeeding experience may be different. Mom made aware of O/P services, breastfeeding support groups, community resources, and our phone # for post-discharge questions.    Maternal Data Has patient been taught Hand Expression?: Yes Does the patient have breastfeeding experience prior to this delivery?: Yes How long did the patient breastfeed?: Per Birth Parent, 1st child was LPTI [redacted] weeks gestation, supplemented with formula and pumped, infant did not latch until [redacted] weeks gestation, seen in [facility] outpatient clinic, she BF him for 5 months. She did alot of pumping.  Feeding Mother's Current Feeding Choice: Breast Milk  LATCH Score Latch: Grasps breast easily, tongue down, lips flanged, rhythmical sucking. (Infant was  taken off the breast multiple times  due Birth Parent feeling a pinch with latch.)  Audible Swallowing: A few with stimulation  Type of Nipple: Everted at rest and after stimulation  Comfort (Breast/Nipple): Soft / non-tender  Hold (Positioning): Assistance needed to correctly position infant at breast and maintain latch.  LATCH Score: 8   Lactation Tools Discussed/Used    Interventions Interventions: Position options;Support pillows;Education;Skin to skin;Assisted with latch;Adjust position;Breast feeding basics reviewed;LC Services brochure  Discharge Pump: DEBP;Personal  Consult Status Consult Status: Follow-up Date: 02/04/23 Follow-up type: In-patient    Frederico Hamman 02/03/2023, 7:48 PM

## 2023-02-03 NOTE — MAU Note (Signed)
.  Kathryn Allen is a 32 y.o. at [redacted]w[redacted]d here in MAU reporting: contractions over the last 24 hours that became increasingly more painful around midnight tonight.  Pt denies SROM, vaginal bleeding or bloody show. Endorses + fetal movement GBS negative Denies history of HSV  Onset of complaint: 0000 Pain score: 9 lower abdomen  Vitals:   02/03/23 0259  BP: 135/86  Pulse: 83  Resp: 17  Temp: 98 F (36.7 C)  SpO2: 100%     FHT: 138bpm Lab orders placed from triage:   mau labor

## 2023-02-03 NOTE — Op Note (Signed)
[name], Kathryn Allen MEDICAL RECORD NO: [id number] ACCOUNT NO: [id number] DATE OF BIRTH: 28-Oct-1990 FACILITY: [facility] LOCATION: [facility] PHYSICIAN:  L. Vincente Poli, MD  Operative Report   DATE OF PROCEDURE: 02/03/2023  PREOPERATIVE DIAGNOSES:  Intrauterine pregnancy, 39 weeks and 1 day and failure to progress.  POSTOPERATIVE DIAGNOSES:  Intrauterine pregnancy, 39 weeks and 1 day and failure to progress.  PROCEDURE:  Primary low transverse cesarean section.  SURGEON:   L. Vincente Poli, MD.  ANESTHESIA:  Epidural.  COMPLICATIONS:  None.  PATHOLOGY: None.  ESTIMATED BLOOD LOSS:  Per record.  DESCRIPTION OF PROCEDURE:  Patient was taken to the operating room.  Her epidural was dosed and found to be adequate.  She was then prepped and draped in the usual sterile fashion.  A low transverse incision was made, carried down to the fascia.  The  fascia was scored in the midline and extended laterally.  The rectus muscles were divided and the peritoneum was entered and the peritoneal incision was then stretched.  The lower uterine segment was identified.  The bladder flap was created and a low  transverse incision was made in the uterus.  Amniotic fluid was clear.  The baby was in cephalic presentation was delivered easily, was a female infant, Apgars 8 at 1 minute, 9 at 5 minutes.  The cord was noted to be short.  The baby was handed off to  the neonatal team in standard fashion.  Placenta was manually removed, noted to be normal intact with 3-vessel cord.  The uterus was exteriorized and cleared of all clots and debris.  The uterine incision was closed in 2 layers using 0 chromic in a  running locked stitch.  Irrigation was performed.  Hemostasis was noted.  The peritoneum was closed using 0 Vicryl.  The fascia was closed using 0 Vicryl in a running stitch.  After irrigation of subcutaneous layer, the skin was closed with a 3-0 Vicryl  on a Keith needle.  Benzoin, Steri-Strips and honeycomb  dressing were applied.  All sponge, lap and instrument counts were correct x2.  The patient went to recovery room in stable condition.   NIK D: 02/03/2023 3:49:14 pm T: 02/03/2023 8:47:00 pm  JOB: [id number]/ [id number]

## 2023-02-03 NOTE — H&P (Signed)
Kathryn Allen is a 32 y.o. G 2 P 1001 at 30 w 1 day presents in active labor. Now has epidural. 3 hours since last exam. OB History     Gravida  2   Para  1   Term  0   Preterm  1   AB  0   Living  1      SAB  0   IAB  0   Ectopic  0   Multiple  0   Live Births  1          Past Medical History:  Diagnosis Date   Allergies    Allergy    Anxiety    Gastritis    GERD (gastroesophageal reflux disease)    Past Surgical History:  Procedure Laterality Date   COLONOSCOPY     age 35   Family History: family history includes Breast cancer in her maternal grandmother; COPD in her paternal grandfather; Diabetes in her maternal grandmother; Hyperlipidemia in her mother; Hypertension in her father; Irritable bowel syndrome in her mother; Stroke in her paternal grandmother. Social History:  reports that she has never smoked. She has never used smokeless tobacco. She reports that she does not drink alcohol and does not use drugs.     Maternal Diabetes: No Genetic Screening: Normal Maternal Ultrasounds/Referrals: Normal Fetal Ultrasounds or other Referrals:  None Maternal Substance Abuse:  No Significant Maternal Medications:  None Significant Maternal Lab Results:  Group B Strep negative Number of Prenatal Visits:greater than 3 verified prenatal visits Other Comments:  None  Review of Systems  All other systems reviewed and are negative.  Maternal Medical History:  Reason for admission: Contractions.     Dilation: 7 Effacement (%): 80 Station: -2 Exam by:: Scientist, product/process development Blood pressure 110/69, pulse 75, temperature 98 F (36.7 C), temperature source Oral, resp. rate 17, height 5\' 4"  (1.626 m), weight 84.8 kg, SpO2 100%, currently breastfeeding. Maternal Exam:  Uterine Assessment: Contraction strength is moderate.  Contraction frequency is irregular.  Abdomen: Fetal presentation: vertex   Fetal Exam Fetal State Assessment: Category I - tracings are  normal.   Physical Exam Vitals and nursing note reviewed. Exam conducted with a chaperone present.  Constitutional:      Appearance: Normal appearance.  HENT:     Head: Normocephalic.  Cardiovascular:     Rate and Rhythm: Normal rate and regular rhythm.  Pulmonary:     Effort: Pulmonary effort is normal.  Neurological:     Mental Status: She is alert.     Prenatal labs: ABO, Rh: --/--/O POS (08/12 0340) Antibody: NEG (08/12 0340) Rubella: Immune (01/10 0000) RPR:    HBsAg: Negative (01/10 0000)  HIV: Non-reactive (01/10 0000)  GBS: Negative/-- (07/24 0000)   Assessment/Plan: IUP at term Labor Recheck cervix in one hour - may need increase in Pitocin and IUPC  Jeani Hawking 02/03/2023, 7:37 AM

## 2023-02-03 NOTE — Brief Op Note (Signed)
02/03/2023  3:44 PM  PATIENT:  Reather Laurence  32 y.o. female  PRE-OPERATIVE DIAGNOSIS:   IUP at 23 w 1 day Failure to progress  POST-OPERATIVE DIAGNOSIS:   Same  PROCEDURE:  Procedure(s): CESAREAN SECTION (N/A)  SURGEON:  Surgeons and Role:    * Marcelle Overlie, MD - Primary  PHYSICIAN ASSISTANT:   ASSISTANTS: none   ANESTHESIA:   epidural  EBL:  per record   BLOOD ADMINISTERED:none  DRAINS: Urinary Catheter (Foley)   LOCAL MEDICATIONS USED:  NONE  SPECIMEN:  No Specimen  DISPOSITION OF SPECIMEN:  N/A  COUNTS:  YES  TOURNIQUET:  * No tourniquets in log *  DICTATION: .Other Dictation: Dictation Number dictated  PLAN OF CARE: Admit to inpatient   PATIENT DISPOSITION:  PACU - hemodynamically stable.   Delay start of Pharmacological VTE agent (>24hrs) due to surgical blood loss or risk of bleeding: not applicable

## 2023-02-03 NOTE — Progress Notes (Signed)
Exam Cervix is unchanged from this morning [place] units is adequate FHR recent increase in late decelerations Recommend Primary LTCS: For no cervical change since this am despite adequate UCs Increase in late decelerations and remote from vaginal delivery Her doula was at the bedside  Patient requested we wait for 30 minutes to discuss again when partner is present

## 2023-02-03 NOTE — Transfer of Care (Signed)
Immediate Anesthesia Transfer of Care Note  Patient: Kathryn Allen  Procedure(s) Performed: CESAREAN SECTION  Patient Location: PACU  Anesthesia Type:Epidural  Level of Consciousness: awake  Airway & Oxygen Therapy: Patient Spontanous Breathing  Post-op Assessment: Report given to RN and Post -op Vital signs reviewed and stable  Post vital signs: Reviewed and stable  Last Vitals:  Vitals Value Taken Time  BP    Temp    Pulse 87 02/03/23 1600  Resp 14 02/03/23 1600  SpO2 100 % 02/03/23 1600  Vitals shown include unfiled device data.  Last Pain:  Vitals:   02/03/23 1241  TempSrc:   PainSc: Asleep         Complications: No notable events documented.

## 2023-02-04 MED ORDER — NALBUPHINE HCL 10 MG/ML IJ SOLN
5.0000 mg | Freq: Once | INTRAMUSCULAR | Status: AC | PRN
  Administered 2023-02-04: 5 mg via INTRAVENOUS
  Filled 2023-02-04: qty 1

## 2023-02-04 MED ORDER — WITCH HAZEL-GLYCERIN EX PADS: 1.0000 | MEDICATED_PAD | CUTANEOUS | Status: DC | PRN

## 2023-02-04 MED ORDER — HYDROXYZINE HCL 25 MG PO TABS
25.0000 mg | ORAL_TABLET | Freq: Four times a day (QID) | ORAL | Status: DC | PRN
  Administered 2023-02-04: 25 mg via ORAL
  Filled 2023-02-04: qty 1

## 2023-02-04 MED ORDER — DIBUCAINE (PERIANAL) 1 % EX OINT: 1.0000 | TOPICAL_OINTMENT | CUTANEOUS | Status: DC | PRN

## 2023-02-04 MED ORDER — URSODIOL 300 MG PO CAPS
300.0000 mg | ORAL_CAPSULE | Freq: Two times a day (BID) | ORAL | Status: DC
  Administered 2023-02-04 (×2): 300 mg via ORAL
  Filled 2023-02-04 (×3): qty 1

## 2023-02-04 NOTE — Lactation Note (Addendum)
This note was copied from a baby's chart. Lactation Consultation Note  Patient Name: Kathryn Allen [id number] Date: 02/04/2023 Age:33 hours Reason for consult: Follow-up assessment;Term;Infant weight loss;Breastfeeding assistance (3 % weight loss, Tia the assist nurse manager was working with mom on the latch as LC entered and LC offered to take over. baby had released , areola more compressible and latched deeper.) Baby fed on the left breast 10 mins with swallows and the nipple appeared well rounded when the baby released.  LC assisted to attempt to latch on the right breast and baby was on/ off , and did not seem interested. Baby STS. Latch score 9  Mom aware the RN or LC can assist with latch.  LC pointed out all the positives so far - 2 wets , 3 stools, baby has been successful with the latch. LC provided reassurance.  LC reviewed the 24 hour Breast feeding goals - feed with feeding cues and by 3 hours if the baby isn't awake, check the diaper, change if needed and STS.  LC reviewed steps for latching - hand express, reverse pressure.  Maternal Data Has patient been taught Hand Expression?: Yes  Feeding Mother's Current Feeding Choice: Breast Milk  LATCH Score Latch: Grasps breast easily, tongue down, lips flanged, rhythmical sucking.  Audible Swallowing: Spontaneous and intermittent  Type of Nipple: Everted at rest and after stimulation (some areola edema, reverse pressure helped)  Comfort (Breast/Nipple): Soft / non-tender  Hold (Positioning): Assistance needed to correctly position infant at breast and maintain latch.  LATCH Score: 9   Lactation Tools Discussed/Used    Interventions Interventions: Breast feeding basics reviewed;Assisted with latch;Skin to skin;Breast massage;Hand express;Reverse pressure;Breast compression;Adjust position;Support pillows;Position options;Education;LC Services brochure  Discharge Pump: Personal;DEBP WIC Program: No  Consult  Status Consult Status: Follow-up Date: 02/05/23 Follow-up type: In-patient    Matilde Sprang  02/04/2023, 10:03 AM

## 2023-02-04 NOTE — Anesthesia Postprocedure Evaluation (Signed)
Anesthesia Post Note  Patient: Kathryn Allen  Procedure(s) Performed: CESAREAN SECTION     Patient location during evaluation: PACU Anesthesia Type: Epidural Level of consciousness: oriented and awake and alert Pain management: pain level controlled Vital Signs Assessment: post-procedure vital signs reviewed and stable Respiratory status: spontaneous breathing, respiratory function stable and patient connected to nasal cannula oxygen Cardiovascular status: blood pressure returned to baseline and stable Postop Assessment: no headache, no backache, no apparent nausea or vomiting and epidural receding Anesthetic complications: no  No notable events documented.  Last Vitals:  Vitals:   02/04/23 0535 02/04/23 0845  BP: 108/73 110/70  Pulse: 69 64  Resp: 16 18  Temp: 37.1 C 37.1 C  SpO2: 97% 100%    Last Pain:  Vitals:   02/04/23 0845  TempSrc: Oral  PainSc: 2                   L 

## 2023-02-04 NOTE — Lactation Note (Signed)
This note was copied from a baby's chart. Lactation Consultation Note Mom called for latch assistance d/t Rt. Nipple cracked and bleeding earlier per mom. LC noted positional stripe. Mom stated that nipple hurts.  Mom had baby on Lt. Nipple but stated it was hurting when mom pulled baby off nipple was pinched. Repositioned baby up a little higher to be closer to mom's breast. Obtained deep latch. Mom denied painful latch. Body alignment, STS, positioning, support, props reviewed. No swallows heard per LC. Mom has generalized edema. Breast compressible but feels heavy. Mom stated her 1st child was born earlier and in NICU. Mom pumped for 5 months. Mom tried to latch after baby was 81 month old but baby preferred bottle by that time. Discussed newborn feeding habits and behavior. Mom stated she couldn't stand the thoughts of putting baby on the Rt. Breast right now. She feels like it needs a break. Asked mom if baby is feeding at this certain time to call for [facility] to see if we can latch on the Rt. Breast to give Lt. Breast a break. Discussed the need for dad to hold baby while mom rest.  Patient Name: Kathryn Allen Date: 02/04/2023 Age:68 hours Reason for consult: Mother's request;Term;Breastfeeding assistance   Maternal Data Has patient been taught Hand Expression?: Yes Does the patient have breastfeeding experience prior to this delivery?: Yes How long did the patient breastfeed?: pumped for 5 months  Feeding    LATCH Score Latch: Grasps breast easily, tongue down, lips flanged, rhythmical sucking.  Audible Swallowing: None  Type of Nipple: Everted at rest and after stimulation  Comfort (Breast/Nipple): Engorged, cracked, bleeding, large blisters, severe discomfort (Rt. nipple positional stripe and mom stated it was bleeding earlier)  Hold (Positioning): Assistance needed to correctly position infant at breast and maintain latch.  LATCH Score: 5   Lactation Tools  Discussed/Used    Interventions Interventions: Breast feeding basics reviewed;Adjust position;Assisted with latch;Support pillows;Skin to skin;Position options;Education;Breast massage;Breast compression  Discharge    Consult Status Consult Status: Follow-up Date: 02/05/23 Follow-up type: In-patient    Charyl Dancer 02/04/2023, 10:56 PM

## 2023-02-04 NOTE — Progress Notes (Signed)
CSW received a consult for PPD and Edinburgh score of 10; and met MOB at bedside to complete a mental health assessment. CSW entered the room, introduced herself and explained the reason for the assessment. MOB presented as calm, was agreeable to consult and remained engaged throughout encounter.   CSW acknowledged [place] score of 10 and listened to MOB explore her feelings about transitioning into motherhood of 2.CSW inquired about MOB's mental health history. MOB reported experiencing PPD after her pregnancy due to infant being premature, was not prepared for the early delivery, FOB broke his foot the first week they went home and breast feeding struggles. MOB reported symptoms that included bonding issues, over thinking and obsessing, nightmares and sleeping issues. MOB reported that she was able to speak with family about her symptoms and they reported giving her sometime before they would recommend her to begin medication for support. MOB reported being able to understand their wishes; however she did not want to begin medication for support. MOB reported beginning to implement ways to handle her new normal of being a mom; which included finally able to latch infant and repair the bonding struggles, listening to music and asking for help with family. MOB reported experiencing anxiety prior to pregnancy and is currently participating in therapy at [facility] with "Fleet Contras". MOB reported currently feeling "good" and bonded well with the infant. CSW provided education regarding the baby blues period vs. perinatal mood disorders, discussed treatment and gave resources for mental health follow up if concerns arise.  CSW recommends self-evaluation during the postpartum time period using the New Mom Checklist from Postpartum Progress and encouraged MOB to contact a medical professional if symptoms are noted at any time. CSW assessed for safety with MOB SI/HI/DV;MOB denied all.   CSW asked MOB  has she selected a pediatrician for infant's follow up visits; MOB said [facility]. MOB reported having all essential items for infant including a carseat, bassinet and crib for safe sleeping. CSW provided review of Sudden Infant Death Syndrome (SIDS) precautions.  CSW identifies no further need for intervention and no barriers to discharge at this time.  Enos Fling, Theresia Majors Clinical Social Worker [phone]

## 2023-02-04 NOTE — Progress Notes (Addendum)
Postpartum Progress Note  Postpartum Day 1 s/p primary Cesarean section for failure to progress.  Subjective:  Patient reports no overnight events.  She reports well controlled pain, ambulating without difficulty, foley in place this AM, tolerating PO.  She reports Negative flatus, Negative BM.  Vaginal bleeding is appropriate.  She reports itching.  Objective: Blood pressure 108/73, pulse 69, temperature 98.8 F (37.1 C), temperature source Oral, resp. rate 16, height 5\' 4"  (1.626 m), weight 84.8 kg, SpO2 97%, unknown if currently breastfeeding.  Physical Exam:  General: alert and no distress Lochia: appropriate Abdomen: soft, ATTP Uterine Fundus: firm Incision: dressing in place DVT Evaluation: No evidence of DVT seen on physical exam.  Recent Labs    02/03/23 0340 02/04/23 0436  HGB 11.3* 8.7*  HCT 35.3* 26.6*    Assessment/Plan: Postpartum Day 1, s/p C-section Baby girl doing well Lactation following Encouraged ambulation today, foley to be removed Itching has been present for several weeks. Bile acids were recently collected and resulted elevated.  Discussed itching related to ICP is likely to resolve spontaneously after delivery Will check LFTs in AM and trial ursodiol Continue routine postpartum care. Anticipate discharge PPD#2 or 3.   LOS: 1 day   Lyn Henri 02/04/2023, 7:49 AM

## 2023-02-05 NOTE — Progress Notes (Signed)
Subjective: Postpartum Day 2: Cesarean Delivery Patient reports tolerating PO, + flatus, and no problems voiding.  Patient reports generalized itching is dramatically improved today.  Objective: Vital signs in last 24 hours: Temp:  [97.9 F (36.6 C)-98.4 F (36.9 C)] 97.9 F (36.6 C) (08/14 0534) Pulse Rate:  [69-79] 69 (08/14 0534) Resp:  [16-18] 16 (08/14 0534) BP: (115-122)/(65-71) 115/71 (08/14 0534) SpO2:  [100 %] 100 % (08/14 0534)  Physical Exam:  General: alert, cooperative, and appears stated age 32: appropriate Uterine Fundus: firm Incision: healing well, no significant drainage, no dehiscence DVT Evaluation: No evidence of DVT seen on physical exam. Negative Homan's sign. No cords or calf tenderness.  Recent Labs    02/03/23 0340 02/04/23 0436  HGB 11.3* 8.7*  HCT 35.3* 26.6*    Assessment/Plan: Status post Cesarean section. Doing well postoperatively.  Continue current care. ICP-dramatically improved pruritus.  D/C medications.  CMP was checked this AM with normal LFTs but creatinine was elevated at 1.13 (0.89 on office labs 8/9).  Will recheck CMP tomorrow am.  Encouraged po hydration.   Mitchel Honour, DO 02/05/2023, 9:41 AM

## 2023-02-05 NOTE — Lactation Note (Signed)
This note was copied from a baby's chart. Lactation Consultation Note Mom called d/t time to feed. Baby not interested in BF. Had large emesis. Explained to mom newborn behavior. Mom had personal hand pump collected a few drops of colostrum. Mom stated breast hurting feeling full. LC offered to set up DEBP, mom agreed.  Mom pumped 20 ml total. Mom didn't want to bottle feed right now, wants to spoon feed. Baby very sleepy. Not interested in taking colostrum. Explained to mom again. Milk storage reviewed. Encouraged mom to try again later. Rest until then.  Patient Name: Kathryn Allen [id number] Date: 02/05/2023 Age:76 hours Reason for consult: Mother's request;Term   Maternal Data Has patient been taught Hand Expression?: Yes  Feeding    LATCH Score Latch: Too sleepy or reluctant, no latch achieved, no sucking elicited.  Audible Swallowing: None  Type of Nipple: Everted at rest and after stimulation  Comfort (Breast/Nipple): Engorged, cracked, bleeding, large blisters, severe discomfort (cracked Rt. nipple/breast fullness)  Hold (Positioning): Assistance needed to correctly position infant at breast and maintain latch.  LATCH Score: 3   Lactation Tools Discussed/Used Tools: Pump;Flanges Flange Size: 18 Breast pump type: Double-Electric Breast Pump Pump Education: Setup, frequency, and cleaning;Milk Storage Reason for Pumping: mom's request d/t fullness of breast Pumping frequency: PRN Pumped volume: 20 mL  Interventions Interventions: DEBP;Breast feeding basics reviewed;Education;Expressed milk;Breast massage;Hand express;Pre-pump if needed;Breast compression  Discharge    Consult Status Consult Status: Follow-up Date: 02/05/23 Follow-up type: In-patient    Kathryn Allen 02/05/2023, 5:25 AM

## 2023-02-06 LAB — CBC
HCT: 28.6 % — ABNORMAL LOW (ref 36.0–46.0)
Hemoglobin: 9.1 g/dL — ABNORMAL LOW (ref 12.0–15.0)
MCH: 29.7 pg (ref 26.0–34.0)
MCHC: 31.8 g/dL (ref 30.0–36.0)
MCV: 93.5 fL (ref 80.0–100.0)
Platelets: 154 10*3/uL (ref 150–400)
RBC: 3.06 MIL/uL — ABNORMAL LOW (ref 3.87–5.11)
RDW: 15.9 % — ABNORMAL HIGH (ref 11.5–15.5)
WBC: 12.7 10*3/uL — ABNORMAL HIGH (ref 4.0–10.5)
nRBC: 0.2 % (ref 0.0–0.2)

## 2023-02-06 LAB — COMPREHENSIVE METABOLIC PANEL
ALT: 15 U/L (ref 0–44)
ALT: 20 U/L (ref 0–44)
AST: 18 U/L (ref 15–41)
AST: 21 U/L (ref 15–41)
Albumin: 2.3 g/dL — ABNORMAL LOW (ref 3.5–5.0)
Albumin: 2.6 g/dL — ABNORMAL LOW (ref 3.5–5.0)
Alkaline Phosphatase: 77 U/L (ref 38–126)
Alkaline Phosphatase: 88 U/L (ref 38–126)
Anion gap: 10 (ref 5–15)
Anion gap: 10 (ref 5–15)
BUN: 14 mg/dL (ref 6–20)
BUN: 16 mg/dL (ref 6–20)
CO2: 23 mmol/L (ref 22–32)
CO2: 24 mmol/L (ref 22–32)
Calcium: 9.1 mg/dL (ref 8.9–10.3)
Calcium: 9.3 mg/dL (ref 8.9–10.3)
Chloride: 105 mmol/L (ref 98–111)
Chloride: 103 mmol/L (ref 98–111)
Creatinine, Ser: 1.28 mg/dL — ABNORMAL HIGH (ref 0.44–1.00)
Creatinine, Ser: 1.04 mg/dL — ABNORMAL HIGH (ref 0.44–1.00)
GFR, Estimated: 57 mL/min — ABNORMAL LOW (ref >60)
GFR, Estimated: >60 mL/min (ref >60)
Glucose, Bld: 77 mg/dL (ref 70–99)
Glucose, Bld: 92 mg/dL (ref 70–99)
Potassium: 3.7 mmol/L (ref 3.5–5.1)
Potassium: 3.7 mmol/L (ref 3.5–5.1)
Sodium: 138 mmol/L (ref 135–145)
Sodium: 137 mmol/L (ref 135–145)
Total Bilirubin: 0.5 mg/dL (ref 0.3–1.2)
Total Bilirubin: 0.3 mg/dL (ref 0.3–1.2)
Total Protein: 5.4 g/dL — ABNORMAL LOW (ref 6.5–8.1)
Total Protein: 5.8 g/dL — ABNORMAL LOW (ref 6.5–8.1)

## 2023-02-06 MED ORDER — OXYCODONE HCL 5 MG PO TABS: 5.0000 mg | ORAL_TABLET | Freq: Four times a day (QID) | ORAL | 0 refills | Status: AC | PRN

## 2023-02-06 MED ORDER — ACETAMINOPHEN 325 MG PO TABS: 650.0000 mg | ORAL_TABLET | Freq: Four times a day (QID) | ORAL | 0 refills | Status: AC | PRN

## 2023-02-06 NOTE — Discharge Summary (Signed)
Postpartum Discharge Summary  Date of Service updated 02/06/23     Patient Name: Kathryn Allen DOB: 08-09-1990 MRN: [id number]  Date of admission: 02/03/2023 Delivery date:02/03/2023 Delivering provider: Marcelle Overlie Date of discharge: 02/06/2023  Admitting diagnosis: Normal labor [O80, Z37.9] S/P cesarean section [Z98.891] Intrauterine pregnancy: [redacted]w[redacted]d     Secondary diagnosis:  Principal Problem:   Normal labor Active Problems:   S/P cesarean section  Additional problems: ICP, elevated creatinine    Discharge diagnosis: Term Pregnancy Delivered                                              Post partum procedures: Augmentation: Pitocin Complications: None  Hospital course: Onset of Labor With Unplanned C/S   32 y.o. yo G2P1102 at [redacted]w[redacted]d was admitted in Active Labor on 02/03/2023. Patient had a labor course significant for . The patient went for cesarean section due to Arrest of Dilation. Delivery details as follows: Membrane Rupture Time/Date: 3:14 PM,02/03/2023  Delivery Method:C-Section, Low Transverse Operative Delivery: Details of operation can be found in separate operative note. Patient had a postpartum course complicated by elevated creatinine secondary to ibuprofen.  She is ambulating,tolerating a regular diet, passing flatus, and urinating well.  Patient is discharged home in stable condition 02/06/23.  Newborn Data: Birth date:02/03/2023 Birth time:3:14 PM Gender:Female Living status:Living Apgars:8 ,9  Weight:3380 g  Magnesium Sulfate received: No BMZ received: No Rhophylac:No MMR:No T-DaP:Given prenatally Flu: No Transfusion:No  Physical exam  Vitals:   02/05/23 0534 02/05/23 1348 02/05/23 2051 02/06/23 0536  BP: 115/71 113/69 127/78 111/69  Pulse: 69 72 84 75  Resp: 16 17 18 16   Temp: 97.9 F (36.6 C) 98.9 F (37.2 C) 98.6 F (37 C) 98.2 F (36.8 C)  TempSrc: Oral Oral Oral Oral  SpO2: 100%  99% 99%  Weight:      Height:       General:  alert, cooperative, and no distress Lochia: appropriate Uterine Fundus: firm Incision: Healing well with no significant drainage DVT Evaluation: No evidence of DVT seen on physical exam. Labs: Lab Results  Component Value Date   WBC 12.7 (H) 02/06/2023   HGB 9.1 (L) 02/06/2023   HCT 28.6 (L) 02/06/2023   MCV 93.5 02/06/2023   PLT 154 02/06/2023      Latest Ref Rng & Units 02/06/2023    1:54 PM  CMP  Glucose 70 - 99 mg/dL 92   BUN 6 - 20 mg/dL 16   Creatinine 7.82 - 1.00 mg/dL 9.56   Sodium 213 - 086 mmol/L 137   Potassium 3.5 - 5.1 mmol/L 3.7   Chloride 98 - 111 mmol/L 103   CO2 22 - 32 mmol/L 24   Calcium 8.9 - 10.3 mg/dL 9.3   Total Protein 6.5 - 8.1 g/dL 5.8   Total Bilirubin 0.3 - 1.2 mg/dL 0.3   Alkaline Phos 38 - 126 U/L 88   AST 15 - 41 U/L 21   ALT 0 - 44 U/L 20    Edinburgh Score:    02/04/2023   12:00 PM  Edinburgh Postnatal Depression Scale Screening Tool  I have been able to laugh and see the funny side of things. 1  I have looked forward with enjoyment to things. 1  I have blamed myself unnecessarily when things went wrong. 2  I have been anxious or  worried for no good reason. 2  I have felt scared or panicky for no good reason. 1  Things have been getting on top of me. 1  I have been so unhappy that I have had difficulty sleeping. 1  I have felt sad or miserable. 1  I have been so unhappy that I have been crying. 0  The thought of harming myself has occurred to me. 0  Edinburgh Postnatal Depression Scale Total 10      After visit meds:  Allergies as of 02/06/2023       Reactions   Shellfish Allergy Hives        Medication List     TAKE these medications    acetaminophen 325 MG tablet Commonly known as: TYLENOL Take 2 tablets (650 mg total) by mouth every 6 (six) hours as needed for mild pain (temperature > 101.5.).   oxyCODONE 5 MG immediate release tablet Commonly known as: Oxy IR/ROXICODONE Take 1 tablet (5 mg total) by mouth every  6 (six) hours as needed for moderate pain. What changed: when to take this   PRENATAL VITAMIN PO Take 1 tablet by mouth daily.         Discharge home in stable condition Infant Feeding: Breast Infant Disposition:home with mother Discharge instruction: per After Visit Summary and Postpartum booklet. Activity: Advance as tolerated. Pelvic rest for 6 weeks.  Diet: routine diet Anticipated Birth Control: Unsure Postpartum Appointment:6 weeks Additional Postpartum F/U:  labs in office next week Future Appointments:No future appointments. Follow up Visit:      02/06/2023 Leslie Andrea, MD

## 2023-02-06 NOTE — Progress Notes (Signed)
Subjective: Postpartum Day 3: Cesarean Delivery Patient reports incisional pain, tolerating PO, + flatus, + BM, and no problems voiding.   Comfortable, soreness over incision with movement Itching resolved  Objective: Vital signs in last 24 hours: Temp:  [98.2 F (36.8 C)-98.9 F (37.2 C)] 98.2 F (36.8 C) (08/15 0536) Pulse Rate:  [72-84] 75 (08/15 0536) Resp:  [16-18] 16 (08/15 0536) BP: (111-127)/(69-78) 111/69 (08/15 0536) SpO2:  [99 %] 99 % (08/15 0536)  Physical Exam:  General: alert, cooperative, and no distress Lochia: appropriate Uterine Fundus: firm Incision: healing well DVT Evaluation: No evidence of DVT seen on physical exam.  Recent Labs    02/04/23 0436  HGB 8.7*  HCT 26.6*   Results for orders placed or performed during the hospital encounter of 02/03/23 (from the past 48 hour(s))  Comprehensive metabolic panel     Status: Abnormal   Collection Time: 02/05/23  4:12 AM  Result Value Ref Range   Sodium 140 135 - 145 mmol/L   Potassium 3.8 3.5 - 5.1 mmol/L   Chloride 105 98 - 111 mmol/L   CO2 25 22 - 32 mmol/L   Glucose, Bld 86 70 - 99 mg/dL    Comment: Glucose reference range applies only to samples taken after fasting for at least 8 hours.   BUN 17 6 - 20 mg/dL   Creatinine, Ser 2.95 (H) 0.44 - 1.00 mg/dL   Calcium 9.5 8.9 - 62.1 mg/dL   Total Protein 5.1 (L) 6.5 - 8.1 g/dL   Albumin 2.3 (L) 3.5 - 5.0 g/dL   AST 17 15 - 41 U/L   ALT 13 0 - 44 U/L   Alkaline Phosphatase 76 38 - 126 U/L   Total Bilirubin 0.2 (L) 0.3 - 1.2 mg/dL   GFR, Estimated >30 >86 mL/min    Comment: (NOTE) Calculated using the CKD-EPI Creatinine Equation (2021)    Anion gap 10 5 - 15    Comment: Performed at [facility], [address] [place], [place] [address]  Comprehensive metabolic panel     Status: Abnormal   Collection Time: 02/06/23  4:09 AM  Result Value Ref Range   Sodium 138 135 - 145 mmol/L   Potassium 3.7 3.5 - 5.1 mmol/L   Chloride 105 98 - 111 mmol/L    CO2 23 22 - 32 mmol/L   Glucose, Bld 77 70 - 99 mg/dL    Comment: Glucose reference range applies only to samples taken after fasting for at least 8 hours.   BUN 14 6 - 20 mg/dL   Creatinine, Ser 9.62 (H) 0.44 - 1.00 mg/dL   Calcium 9.1 8.9 - 95.2 mg/dL   Total Protein 5.4 (L) 6.5 - 8.1 g/dL   Albumin 2.3 (L) 3.5 - 5.0 g/dL   AST 18 15 - 41 U/L   ALT 15 0 - 44 U/L   Alkaline Phosphatase 77 38 - 126 U/L   Total Bilirubin 0.5 0.3 - 1.2 mg/dL   GFR, Estimated 57 (L) >60 mL/min    Comment: (NOTE) Calculated using the CKD-EPI Creatinine Equation (2021)    Anion gap 10 5 - 15    Comment: Performed at [facility], [address] [place], [place] [address]     Assessment/Plan: Status post Cesarean section. Postoperative course complicated by ICP and creatinine elevation   Zophia reports elevated creatinine in 2017 secondary to ibuprofen for dysmenorrhea. Creatinine up today. > stop ibuprofen, repeat labs 1400 Push PO fluids.  Roselle Locus II, MD 02/06/2023, 7:49 AM

## 2023-02-06 NOTE — Lactation Note (Signed)
This note was copied from a baby's chart. Lactation Consultation Note  Patient Name: Kathryn Allen [id number] Date: 02/06/2023 Age:33 hours  Reason for consult: Follow-up assessment;Difficult latch;Nipple pain/trauma  P2 (pumped with first baby due to 35 weeks), [redacted]w[redacted]d GA, 8% weight loss  Mother has been breastfeeding, pumping and feeding mother's EBM (expressing 20-30 ml) by bottle and feeding with DBM by bottle. Mother has healing positional stripes on both nipples and she has been pumping to allow nipples to heal from latching. She gave formula during the night and this morning to give her "nipples a break".   Infant just took 29 ml of formula and sleeping. Mother will call with next feeding. Discussed attempting the use of a nipple shield with latch. Mother inquiring about sustaining milk production if she is unable to latch baby. Discussed supply and demand. Mother would need to pump 8 times/24 hours. She has a personal DEBP for home use.   Mother has follow up breastfeeding support and has an appointment scheduled next Tuesday with the [facility].    Maternal Data    Feeding Mother's Current Feeding Choice: Breast Milk and Formula Nipple Type: Nfant Standard Flow (white)  LATCH Score   Not observed                 Lactation Tools Discussed/Used Tools: Pump;Flanges Flange Size: 18 Breast pump type: Double-Electric Breast Pump Pump Education: Setup, frequency, and cleaning;Milk Storage Reason for Pumping: sore nipples, resting/healing Pumping frequency: every 3 hours due to not latching baby Pumped volume: 30 mL  Interventions Interventions: Education  Discharge    Consult Status Consult Status: Follow-up Date: 02/06/23 Follow-up type: In-patient    Christella Hartigan M 02/06/2023, 9:20 AM

## 2023-02-06 NOTE — Progress Notes (Signed)
Wants to go home  Today's Vitals   02/06/23 0535 02/06/23 0536 02/06/23 0635 02/06/23 0930  BP:  111/69    Pulse:  75    Resp:  16    Temp:  98.2 F (36.8 C)    TempSrc:  Oral    SpO2:  99%    Weight:      Height:      PainSc: 5   0-No pain 2    Body mass index is 32.08 kg/m.  \  Results for orders placed or performed during the hospital encounter of 02/03/23 (from the past 24 hour(s))  Comprehensive metabolic panel     Status: Abnormal   Collection Time: 02/06/23  4:09 AM  Result Value Ref Range   Sodium 138 135 - 145 mmol/L   Potassium 3.7 3.5 - 5.1 mmol/L   Chloride 105 98 - 111 mmol/L   CO2 23 22 - 32 mmol/L   Glucose, Bld 77 70 - 99 mg/dL   BUN 14 6 - 20 mg/dL   Creatinine, Ser 1.61 (H) 0.44 - 1.00 mg/dL   Calcium 9.1 8.9 - 09.6 mg/dL   Total Protein 5.4 (L) 6.5 - 8.1 g/dL   Albumin 2.3 (L) 3.5 - 5.0 g/dL   AST 18 15 - 41 U/L   ALT 15 0 - 44 U/L   Alkaline Phosphatase 77 38 - 126 U/L   Total Bilirubin 0.5 0.3 - 1.2 mg/dL   GFR, Estimated 57 (L) >60 mL/min   Anion gap 10 5 - 15  CBC     Status: Abnormal   Collection Time: 02/06/23  1:54 PM  Result Value Ref Range   WBC 12.7 (H) 4.0 - 10.5 K/uL   RBC 3.06 (L) 3.87 - 5.11 MIL/uL   Hemoglobin 9.1 (L) 12.0 - 15.0 g/dL   HCT 04.5 (L) 40.9 - 81.1 %   MCV 93.5 80.0 - 100.0 fL   MCH 29.7 26.0 - 34.0 pg   MCHC 31.8 30.0 - 36.0 g/dL   RDW 91.4 (H) 78.2 - 95.6 %   Platelets 154 150 - 400 K/uL   nRBC 0.2 0.0 - 0.2 %  Comprehensive metabolic panel     Status: Abnormal   Collection Time: 02/06/23  1:54 PM  Result Value Ref Range   Sodium 137 135 - 145 mmol/L   Potassium 3.7 3.5 - 5.1 mmol/L   Chloride 103 98 - 111 mmol/L   CO2 24 22 - 32 mmol/L   Glucose, Bld 92 70 - 99 mg/dL   BUN 16 6 - 20 mg/dL   Creatinine, Ser 2.13 (H) 0.44 - 1.00 mg/dL   Calcium 9.3 8.9 - 08.6 mg/dL   Total Protein 5.8 (L) 6.5 - 8.1 g/dL   Albumin 2.6 (L) 3.5 - 5.0 g/dL   AST 21 15 - 41 U/L   ALT 20 0 - 44 U/L   Alkaline Phosphatase 88  38 - 126 U/L   Total Bilirubin 0.3 0.3 - 1.2 mg/dL   GFR, Estimated >57 >84 mL/min   Anion gap 10 5 - 15    A/P: Discharge home         Instructions given         No NSAID, no ibuprofen Tylenol, oxycodone prn Labs in office next week

## 2023-03-03 ENCOUNTER — Telehealth (HOSPITAL_COMMUNITY): Payer: Self-pay | Admitting: *Deleted

## 2023-03-03 NOTE — Telephone Encounter (Signed)
03/03/2023  Name: Kathryn Allen MRN: [id number] DOB: [birth year]  Reason for Call:  Transition of Care Hospital Discharge Call  Contact Status: Patient Contact Status: Message  Language assistant needed:          Follow-Up Questions:    Inocente Salles Postnatal Depression Scale:  In the Past 7 Days:    PHQ2-9 Depression Scale:     Discharge Follow-up:    Post-discharge interventions: NA  Salena Saner, RN 03/03/2023 14:47

## 2023-04-08 IMAGING — MG DIGITAL DIAGNOSTIC BILAT W/ TOMO W/ CAD
8 of 14 series · 8 of 40 positions shown · non-contrast
Comparison: None.

CLINICAL DATA: 30-year-old female presenting for evaluation of a
new lump in the left breast. Patient additionally reports chronic
recurrent and sometimes painful lumps in her bilateral axilla. She
reports no current flare. Patient is currently 7 weeks postpartum
and breast feeding.

EXAM:
DIGITAL DIAGNOSTIC BILATERAL MAMMOGRAM WITH TOMOSYNTHESIS AND CAD;
ULTRASOUND LEFT BREAST LIMITED; US AXILLARY RIGHT
TECHNIQUE: Bilateral digital diagnostic mammography and breast tomosynthesis
was performed. The images were evaluated with computer-aided
detection.; Targeted ultrasound examination of the left breast was
performed.; Targeted ultrasound examination of the right axilla was
performed.

[L MLO synth-2D]
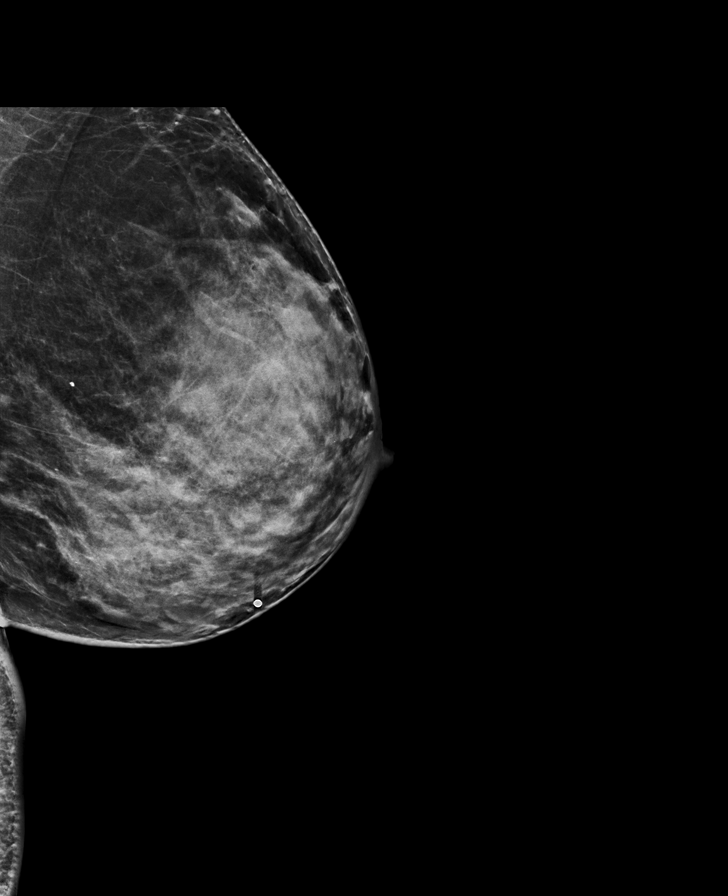

[R MLO synth-2D]
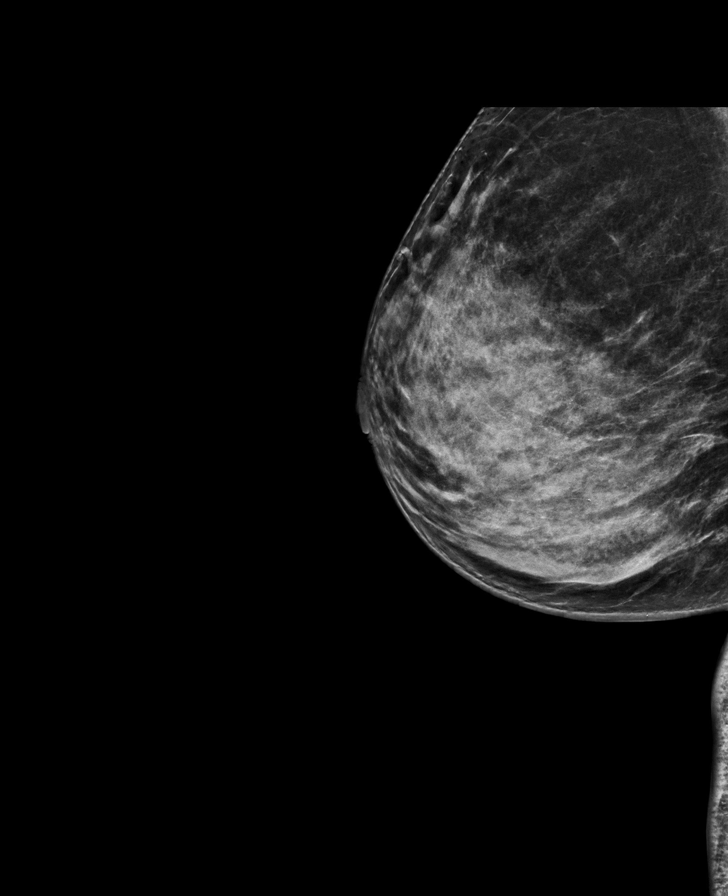

[L AT synth-2D]
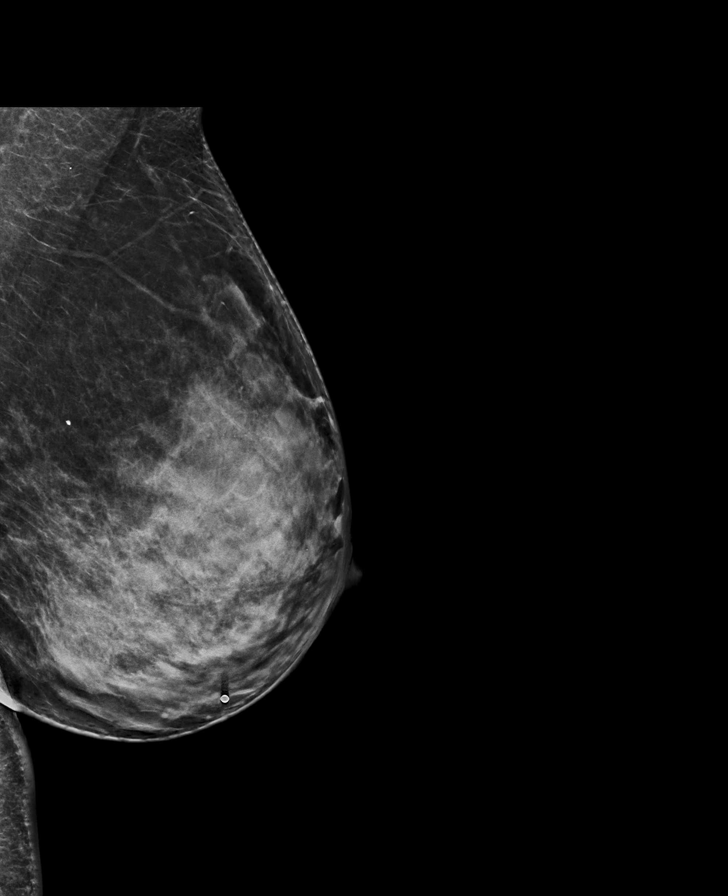

[L TAN synth-2D]
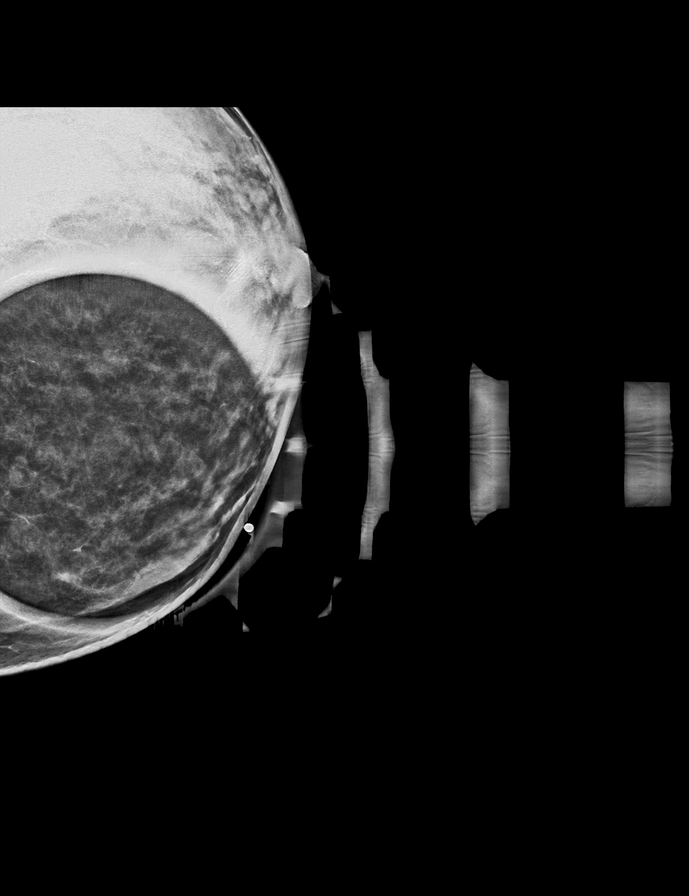

[R AT synth-2D]
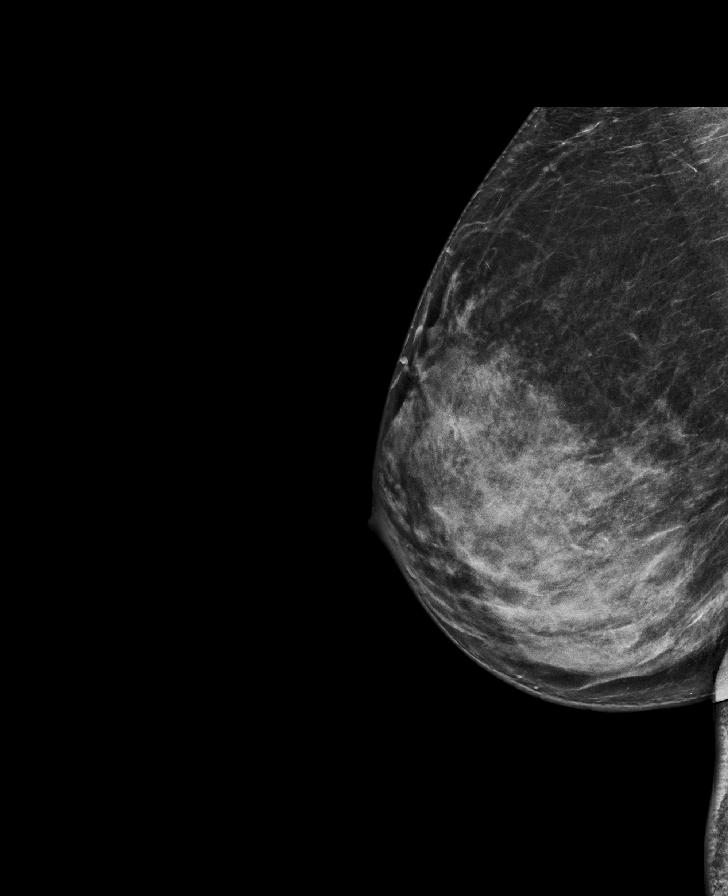

[R CC synth-2D]
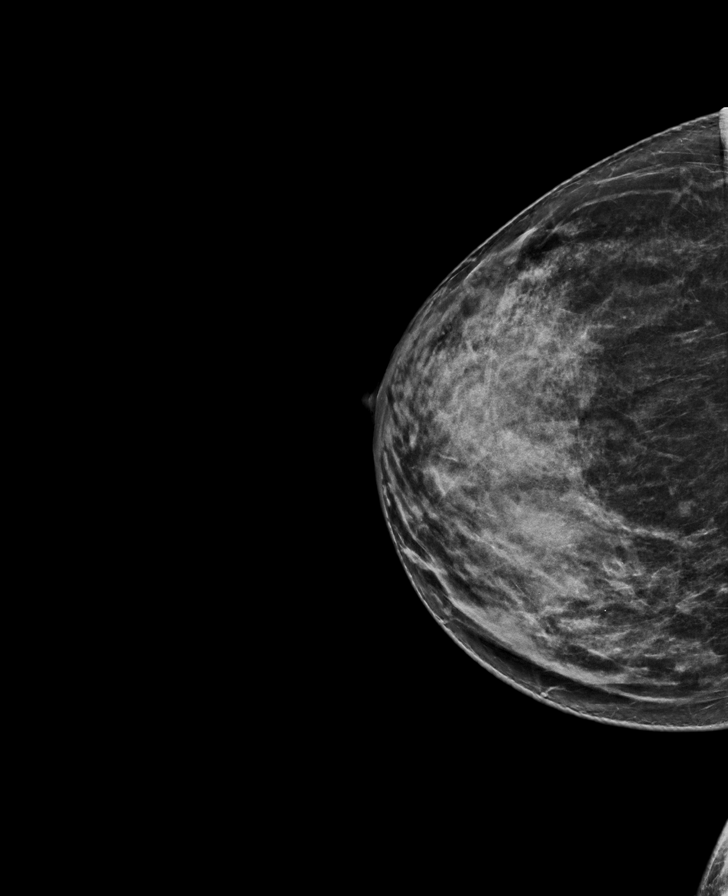

[L CC synth-2D]
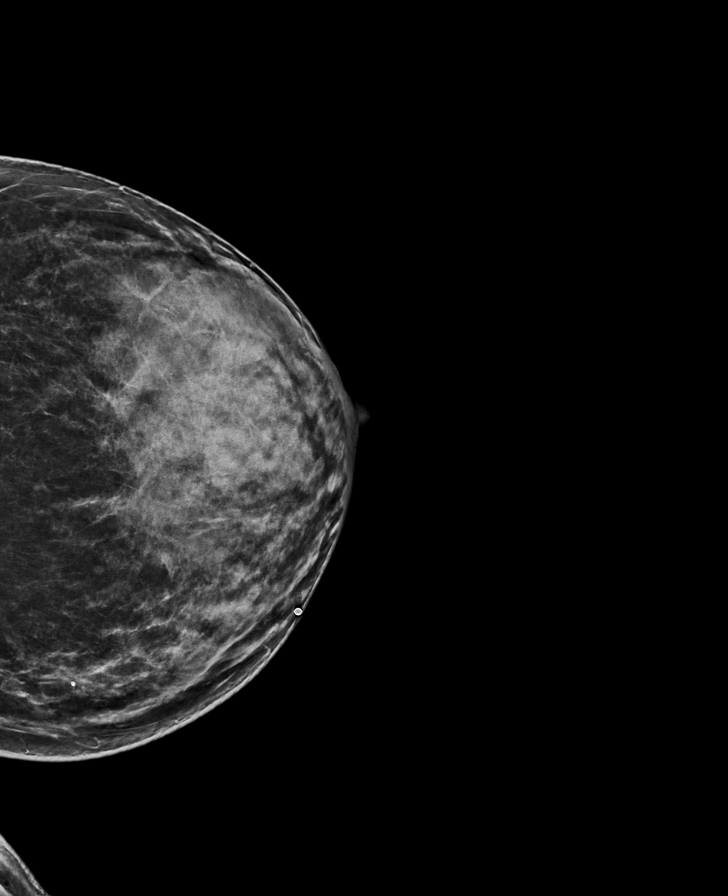

[L MLO tomo · tomo slice 35/68.0]
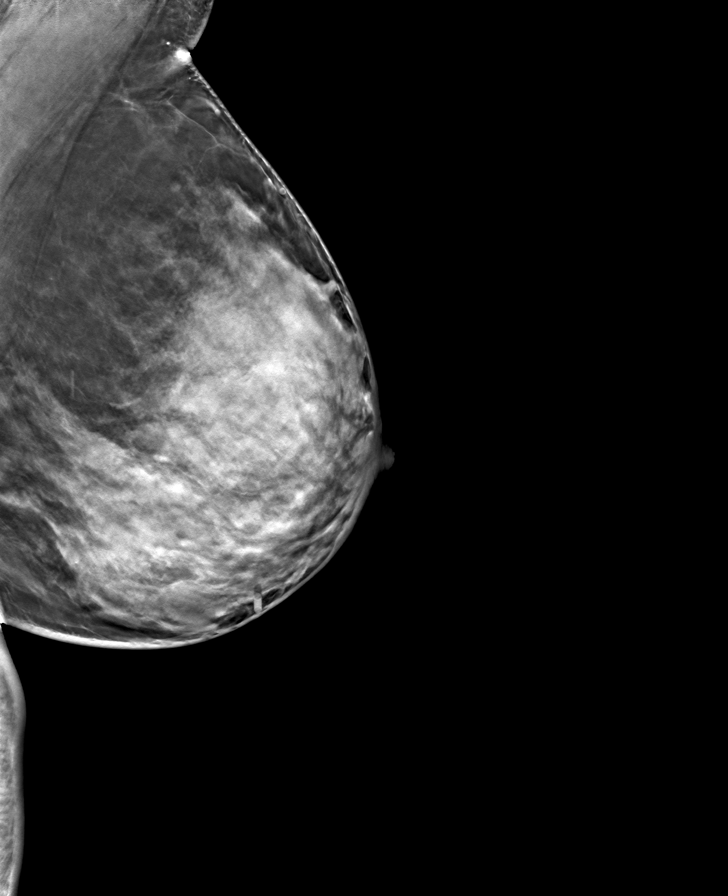

[8 of 40 positions shown; findings below may reference images not displayed]

ACR Breast Density Category d: The breast tissue is extremely dense,
which lowers the sensitivity of mammography.
FINDINGS: Mammogram:

Right breast: No suspicious mass, distortion, or microcalcifications
are identified to suggest presence of malignancy.

Left breast: A skin BB marks the palpable site of concern reported
by the patient in the lower inner left breast. A spot tangential
view of this area is performed in addition to standard views. At the
palpable site there is an oval circumscribed mass measuring
approximately 1.5 cm. No new findings elsewhere in the left breast.

On physical exam at the site of concern reported by the patient in
the lower inner left breast I feel a discrete mobile mass. In the
bilateral axillary regions there is no obvious rash or superficial
bump. No erythema. No wound or skin breakdown.

Ultrasound:

Targeted ultrasound is performed in the left breast at 8 o'clock 5
cm from the nipple demonstrating an oval circumscribed anechoic mass
measuring 1.7 x 1.1 x 1.5 cm, consistent with a benign simple cyst.

Targeted ultrasound of the bilateral axillary regions demonstrates
small oval subcentimeter cystic masses within the skin. Additionally
there is a small superficial collection in the right axilla
measuring 2.3 cm. There is no suspicious solid mass.
IMPRESSION: 1. At the palpable site of concern in the left breast there is a
benign simple cyst.

2. Clinical and imaging findings in the bilateral axillary regions
raise suspicion for hidradenitis suppurativa.

RECOMMENDATION:
1. Continue routine clinical breast exams and begin screening at age
39.

2. Surgical consultation for hidradenitis. May consider delaying
this for a few months as patient is recently postpartum.

I have discussed the findings and recommendations with the patient.
If applicable, a reminder letter will be sent to the patient
regarding the next appointment.

BI-RADS CATEGORY  2: Benign.

## 2023-08-20 ENCOUNTER — Ambulatory Visit: Payer: Managed Care, Other (non HMO) | Admitting: Physician Assistant

## 2023-08-20 ENCOUNTER — Encounter: Payer: Self-pay | Admitting: Physician Assistant

## 2023-08-20 VITALS — BP 120/72 | HR 70 | Ht 64.0 in | Wt 149.0 lb

## 2023-08-20 DIAGNOSIS — G8929 Other chronic pain: Secondary | ICD-10-CM

## 2023-08-20 DIAGNOSIS — Z860101 Personal history of adenomatous and serrated colon polyps: Secondary | ICD-10-CM

## 2023-08-20 DIAGNOSIS — K625 Hemorrhage of anus and rectum: Secondary | ICD-10-CM

## 2023-08-20 DIAGNOSIS — R109 Unspecified abdominal pain: Secondary | ICD-10-CM

## 2023-08-20 DIAGNOSIS — Z860100 Personal history of colon polyps, unspecified: Secondary | ICD-10-CM | POA: Diagnosis not present

## 2023-08-20 DIAGNOSIS — K644 Residual hemorrhoidal skin tags: Secondary | ICD-10-CM

## 2023-08-20 MED ORDER — HYDROCORTISONE (PERIANAL) 2.5 % EX CREA: 1.0000 | TOPICAL_CREAM | Freq: Two times a day (BID) | CUTANEOUS | 1 refills | Status: AC

## 2023-08-20 MED ORDER — HYDROCORTISONE 1 % EX OINT: 1.0000 | TOPICAL_OINTMENT | Freq: Two times a day (BID) | CUTANEOUS | 0 refills | Status: DC

## 2023-08-20 NOTE — Progress Notes (Signed)
 Chief Complaint: Rectal bleeding  HPI:    Kathryn Allen is a 33 year old African-American female with a past medical history as listed below including reflux, previously known to Dr. Orvan Falconer, who was referred to me by Kathryn Humphrey, Kathryn Allen for a complaint of rectal bleeding.      09/12/2017 abdominal ultrasound with possible right nephrolithiasis, otherwise normal.    01/21/2018 CT renal stone study done for right upper quadrant pain and heartburn with nonobstructing right renal calculi and otherwise negative.    02/02/2020 CBC, CMP and lipid panel normal    03/17/2020 office visit with me and at that time described right upper quadrant pain ever since 2019.  Tells me that she had right upper quadrant abdominal pain with her menstrual cycle.  Also describes radiating from normal stools to constipation with some rectal bleeding at times.    05/11/2020 EGD and colonoscopy.  EGD with gastritis and otherwise normal.  Pathology showed reactive gastropathy.  Patient was told to start pantoprazole 40 mg every morning x8 weeks and not use any NSAIDs.  Colonoscopy on the same day with hemorrhoids, erythematous mucosa in the sigmoid colon and 2 polyps.  Pathology showed sessile serrated polyps and repeat colonoscopy recommended in 5 years.    07/19/2020 patient seen in clinic and at that time doing fairly well.  She was on Pantoprazole 40 mg daily switched to Omeprazole 20 mg 2 tabs once daily.  Was using MiraLAX every other day which helped with hemorrhoids.  Discussed chronic right upper quadrant pain with previous CT, ultrasound and labs normal, recent EGD at the time with gastritis, seem to be related to her menstrual cycle, IBS.  Recommended continuing a high-fiber diet and MiraLAX.  She discussed referral to a dietitian to further discuss IBS.  She discontinued Omeprazole.    02/03/2023 patient had a C-section.    Today, the patient tells me that she has done fairly well.  She actually had not been having any  abdominal pain at all over the past couple of years because she has been pretty consistently pregnant, her youngest is now 24 months old.  Tells me after she started with her first menstrual cycle. She did have a little bit of abdominal pain so she is pretty sure that this is related.  This does not really bother her that much.    Here today really to discuss some rectal bleeding.  Tells me that when she has a bowel movement she typically sees some bleeding on the toilet paper and in the toilet bowl and on the stool.  This has been happening pretty consistently over the past 4 to 5 months, but over the past 2 weeks she has had no further bleeding at all.  It was somewhat painful and she had passed a stool with a sharp pain prior to all of this, not anymore.  She does feel something back there.    Denies fever, chills or weight loss.  Past Medical History:  Diagnosis Date   Allergies    Allergy    Anxiety    Gastritis    GERD (gastroesophageal reflux disease)     Past Surgical History:  Procedure Laterality Date   CESAREAN SECTION N/A 02/03/2023   Procedure: CESAREAN SECTION;  Surgeon: Kathryn Overlie, Kathryn Allen;  Location: [facility];  Service: Obstetrics;  Laterality: N/A;   COLONOSCOPY     age 42    Current Outpatient Medications  Medication Sig Dispense Refill   acetaminophen (TYLENOL) 325 MG  tablet Take 2 tablets (650 mg total) by mouth every 6 (six) hours as needed for mild pain (temperature > 101.5.). 90 tablet 0   oxyCODONE (OXY IR/ROXICODONE) 5 MG immediate release tablet Take 1 tablet (5 mg total) by mouth every 6 (six) hours as needed for moderate pain. 20 tablet 0   Prenatal Vit-Fe Fumarate-FA (PRENATAL VITAMIN PO) Take 1 tablet by mouth daily.     No current facility-administered medications for this visit.    Allergies as of 08/20/2023 - Review Complete 02/03/2023  Allergen Reaction Noted   Shellfish allergy Hives 08/27/2018    Family History  Problem Relation Age of Onset    Breast cancer Maternal Grandmother    Diabetes Maternal Grandmother    Irritable bowel syndrome Mother    Hyperlipidemia Mother    Hypertension Father    Stroke Paternal Grandmother    COPD Paternal Grandfather    Bladder Cancer Neg Hx    Kidney cancer Neg Hx    Colon cancer Neg Hx    Colon polyps Neg Hx    Esophageal cancer Neg Hx    Rectal cancer Neg Hx    Stomach cancer Neg Hx     Social History   Socioeconomic History   Marital status: Married    Spouse name: Not on file   Number of children: 0   Years of education: Not on file   Highest education level: Not on file  Occupational History   Occupation: Education officer, museum  Tobacco Use   Smoking status: Never   Smokeless tobacco: Never  Vaping Use   Vaping status: Never Used  Substance and Sexual Activity   Alcohol use: Never   Drug use: Never   Sexual activity: Yes    Partners: Male    Birth control/protection: OCP, None  Other Topics Concern   Not on file  Social History Narrative   Not on file   Social Drivers of Health   Financial Resource Strain: Not on file  Food Insecurity: No Food Insecurity (02/03/2023)   Hunger Vital Sign    Worried About Running Out of Food in the Last Year: Never true    Ran Out of Food in the Last Year: Never true  Transportation Needs: No Transportation Needs (02/03/2023)   PRAPARE - Administrator, Civil Service (Medical): No    Lack of Transportation (Non-Medical): No  Physical Activity: Not on file  Stress: Not on file  Social Connections: Not on file  Intimate Partner Violence: Not At Risk (02/03/2023)   Humiliation, Afraid, Rape, and Kick questionnaire    Fear of Current or Ex-Partner: No    Emotionally Abused: No    Physically Abused: No    Sexually Abused: No    Review of Systems:    Constitutional: No weight loss, fever or chills Skin: No rash Cardiovascular: No chest pain  Respiratory: No SOB  Gastrointestinal: See HPI and otherwise  negative Genitourinary: No dysuria  Neurological: No headache, dizziness or syncope Musculoskeletal: No new muscle or joint pain Hematologic: No bruising Psychiatric: No history of depression or anxiety   Physical Exam:  Vital signs: BP 120/72   Pulse 70   Ht 5\' 4"  (1.626 m)   Wt 149 lb (67.6 kg)   LMP 07/27/2023   BMI 25.58 kg/m   Constitutional:   Pleasant AA female appears to be in NAD, Well developed, Well nourished, alert and cooperative Head:  Normocephalic and atraumatic. Eyes:   PEERL, EOMI. No icterus. Conjunctiva  pink. Ears:  Normal auditory acuity. Neck:  Supple Throat: Oral cavity and pharynx without inflammation, swelling or lesion.  Respiratory: Respirations even and unlabored. Lungs clear to auscultation bilaterally.   No wheezes, crackles, or rhonchi.  Cardiovascular: Normal S1, S2. No MRG. Regular rate and rhythm. No peripheral edema, cyanosis or pallor.  Gastrointestinal:  Soft, nondistended, nontender. No rebound or guarding. Normal bowel sounds. No appreciable masses or hepatomegaly. Rectal: External: External hemorrhoid tag posteriorly; internal: No tenderness or mass, no residue; and anoscopy: No fissure Msk:  Symmetrical without gross deformities. Without edema, no deformity or joint abnormality.  Neurologic:  Alert and  oriented x4;  grossly normal neurologically.  Skin:   Dry and intact without significant lesions or rashes. Psychiatric: Demonstrates good judgement and reason without abnormal affect or behaviors.  RELEVANT LABS AND IMAGING: CBC    Component Value Date/Time   WBC 12.7 (H) 02/06/2023 1354   RBC 3.06 (L) 02/06/2023 1354   HGB 9.1 (L) 02/06/2023 1354   HGB 12.8 03/27/2021 0932   HCT 28.6 (L) 02/06/2023 1354   HCT 38.9 03/27/2021 0932   PLT 154 02/06/2023 1354   PLT 102 (L) 03/27/2021 0932   MCV 93.5 02/06/2023 1354   MCV 91 03/27/2021 0932   MCH 29.7 02/06/2023 1354   MCHC 31.8 02/06/2023 1354   RDW 15.9 (H) 02/06/2023 1354   RDW  12.7 03/27/2021 0932   LYMPHSABS 1.6 03/27/2021 0932   EOSABS 0.3 03/27/2021 0932   BASOSABS 0.1 03/27/2021 0932    CMP     Component Value Date/Time   NA 137 02/06/2023 1354   NA 133 (L) 03/27/2021 0929   K 3.7 02/06/2023 1354   CL 103 02/06/2023 1354   CO2 24 02/06/2023 1354   GLUCOSE 92 02/06/2023 1354   BUN 16 02/06/2023 1354   BUN 10 03/27/2021 0929   CREATININE 1.04 (H) 02/06/2023 1354   CALCIUM 9.3 02/06/2023 1354   PROT 5.8 (L) 02/06/2023 1354   PROT 7.3 03/27/2021 0929   ALBUMIN 2.6 (L) 02/06/2023 1354   ALBUMIN 4.6 03/27/2021 0929   AST 21 02/06/2023 1354   ALT 20 02/06/2023 1354   ALKPHOS 88 02/06/2023 1354   BILITOT 0.3 02/06/2023 1354   BILITOT 0.3 03/27/2021 0929   GFRNONAA >60 02/06/2023 1354    Assessment: 1.  Rectal bleeding: Worse over the past 4 to 5 months, but none over the past 2 weeks, when she was having bleeding she did have some rectal pain, no longer, external hemorrhoid tag on exam; possibly a fissure that is healed +/- hemorrhoid 2.  History of colon polyps: Repeat colonoscopy recommended next year, patient is in recall 3.  Abdominal pain: Chronic for the patient, again thought related to her menstrual cycles possible endometriosis  Plan: 1.  Prescribed Hydrocortisone ointment 2.5% to be applied twice daily for 1 to 2 weeks if her hemorrhoid ever bothers her again or she develops pain.  If this is not helping to take care of it then would recommend she come back in the clinic so we can see if she has a fissure. 2.  Recommended keeping soft solid stools daily with fiber and water 3.  Patient was placed in recall for a follow-up colonoscopy with Dr. Chales Abrahams next year due to history of polyps 4.  Patient to follow in clinic with [place] as needed.  Hyacinth Meeker, PA-C [facility] 08/20/2023, 11:11 AM  Cc: Kathryn Humphrey, Kathryn Allen

## 2023-08-20 NOTE — Patient Instructions (Addendum)
 _______________________________________________________  If your blood pressure at your visit was 140/90 or greater, please contact your primary care physician to follow up on this.  If you are age 35 or younger, your body mass index should be between 19-25. Your Body mass index is 25.58 kg/m. If this is out of the aformentioned range listed, please consider follow up with your Primary Care Provider.  ________________________________________________________  The [facility] providers would like to encourage you to use [facility] to communicate with providers for non-urgent requests or questions.  Due to long hold times on the telephone, sending your provider a message by [facility] may be a faster and more efficient way to get a response.  Please allow 48 business hours for a response.  Please remember that this is for non-urgent requests.  _______________________________________________________  We have sent the following medications to your pharmacy for you to pick up at your convenience:  START:Hydrocortisone ointment two times daily for 7 to 14 days for hemorrhoids.  Recall colonoscopy in for Nov 2026.  Thank you for entrusting me with your care and choosing [facility].  Hyacinth Meeker, PA-C

## 2023-10-31 ENCOUNTER — Ambulatory Visit
Admission: EM | Admit: 2023-10-31 | Discharge: 2023-10-31 | Disposition: A | Discharge status: Home / Self Care | Attending: Family Medicine | Admitting: Family Medicine

## 2023-10-31 DIAGNOSIS — J988 Other specified respiratory disorders: Secondary | ICD-10-CM | POA: Diagnosis not present

## 2023-10-31 DIAGNOSIS — J309 Allergic rhinitis, unspecified: Secondary | ICD-10-CM

## 2023-10-31 DIAGNOSIS — B9789 Other viral agents as the cause of diseases classified elsewhere: Secondary | ICD-10-CM | POA: Diagnosis not present

## 2023-10-31 LAB — POC COVID19/FLU A&B COMBO
Covid Antigen, POC: NEGATIVE
Influenza A Antigen, POC: NEGATIVE
Influenza B Antigen, POC: NEGATIVE

## 2023-10-31 MED ORDER — PREDNISONE 20 MG PO TABS: ORAL_TABLET | ORAL | 0 refills | Status: AC

## 2023-10-31 NOTE — Discharge Instructions (Signed)
 We will manage this as a viral respiratory infection likely made worse by the allergies you have. For sore throat or cough try using a honey-based tea. Use 3 teaspoons of honey with juice squeezed from half lemon. Place shaved pieces of ginger into 1/2-1 cup of water  and warm over stove top. Then mix the ingredients and repeat every 4 hours as needed. Please take Tylenol  500mg -650mg  once every 6 hours for fevers, aches and pains. Hydrate very well with at least 2 liters (64 ounces) of water . Eat light meals such as soups (chicken and noodles, chicken wild rice, vegetable).  Do not eat any foods that you are allergic to.  Start an antihistamine like Zyrtec (10mg  daily) for postnasal drainage, sinus congestion through the end of May.  You can take this together with prednisone for 5 days to situate your allergic rhinitis flare.  After you finish the prednisone course, you can restart your nasal spray.

## 2023-10-31 NOTE — ED Triage Notes (Addendum)
 Pt c/o prod cough, head/chest congestion, sore throat x 3 days-states she has had same c/o x 3 since March-states she completed abx for sinus infection ~2 weeks ago-NAD-steady gait

## 2023-10-31 NOTE — ED Provider Notes (Signed)
 [facility]  Note:  This document was prepared using Conservation officer, historic buildings and may include unintentional dictation errors.  MRN: [id number] DOB: 12-25-1990  Subjective:   Kathryn Allen is a 33 y.o. female presenting for 1-2 month history of persistent intermittent sinus congestion, drainage, coughing, throat pain, chest congestion.  Has a history of allergies but does not take anything consistently for this.  She was seen before at a different clinic and she took a course of amoxicillin and finished this 2 weeks ago. Started taking Zyrtec and nasal steroid 2 days ago.  Her current flare had started after she spent a significant time outdoors 3 days ago.  In fact, her entire family had the same respiratory symptoms after they spent time outdoors.  No history of asthma.  No fever, shortness of breath or wheezing.  No rashes.  No nausea, vomiting, abdominal pain.  No ear symptoms.  No smoking of any kind including cigarettes, cigars, vaping, marijuana use.  Patient breast-feeds her infant only a few times a week.  No current facility-administered medications for this encounter.  Current Outpatient Medications:    acetaminophen  (TYLENOL ) 325 MG tablet, Take 2 tablets (650 mg total) by mouth every 6 (six) hours as needed for mild pain (temperature > 101.5.). (Patient not taking: Reported on 08/20/2023), Disp: 90 tablet, Rfl: 0   hydrocortisone  (ANUSOL -HC) 2.5 % rectal cream, Place 1 Application rectally 2 (two) times daily., Disp: 30 g, Rfl: 1   oxyCODONE  (OXY IR/ROXICODONE ) 5 MG immediate release tablet, Take 1 tablet (5 mg total) by mouth every 6 (six) hours as needed for moderate pain. (Patient not taking: Reported on 08/20/2023), Disp: 20 tablet, Rfl: 0   Prenatal Vit-Fe Fumarate-FA (PRENATAL VITAMIN PO), Take 1 tablet by mouth daily., Disp: , Rfl:    Allergies  Allergen Reactions   Shellfish Allergy Hives    Past Medical History:  Diagnosis Date    Allergies    Allergy    Anxiety    Gastritis    GERD (gastroesophageal reflux disease)      Past Surgical History:  Procedure Laterality Date   CESAREAN SECTION N/A 02/03/2023   Procedure: CESAREAN SECTION;  Surgeon: Thurman Flores, MD;  Location: [facility] LD ORS;  Service: Obstetrics;  Laterality: N/A;   COLONOSCOPY     age 33    Family History  Problem Relation Age of Onset   Breast cancer Maternal Grandmother    Diabetes Maternal Grandmother    Irritable bowel syndrome Mother    Hyperlipidemia Mother    Hypertension Father    Stroke Paternal Grandmother    COPD Paternal Grandfather    Bladder Cancer Neg Hx    Kidney cancer Neg Hx    Colon cancer Neg Hx    Colon polyps Neg Hx    Esophageal cancer Neg Hx    Rectal cancer Neg Hx    Stomach cancer Neg Hx     Social History   Tobacco Use   Smoking status: Never   Smokeless tobacco: Never  Vaping Use   Vaping status: Never Used  Substance Use Topics   Alcohol use: Never   Drug use: Never    ROS   Objective:   Vitals: BP 121/74 (BP Location: Right Arm)   Pulse 78   Temp 99 F (37.2 C) (Oral)   Resp 20   LMP 10/27/2023   SpO2 97%   Physical Exam Constitutional:      General: She is not  in acute distress.    Appearance: Normal appearance. She is well-developed and normal weight. She is not ill-appearing, toxic-appearing or diaphoretic.  HENT:     Head: Normocephalic and atraumatic.     Right Ear: Tympanic membrane, ear canal and external ear normal. No drainage or tenderness. No middle ear effusion. There is no impacted cerumen. Tympanic membrane is not erythematous or bulging.     Left Ear: Tympanic membrane, ear canal and external ear normal. No drainage or tenderness.  No middle ear effusion. There is no impacted cerumen. Tympanic membrane is not erythematous or bulging.     Nose: Nose normal. No congestion or rhinorrhea.     Mouth/Throat:     Mouth: Mucous membranes are moist. No oral lesions.      Pharynx: No pharyngeal swelling, oropharyngeal exudate, posterior oropharyngeal erythema or uvula swelling.     Tonsils: No tonsillar exudate or tonsillar abscesses.  Eyes:     General: No scleral icterus.       Right eye: No discharge.        Left eye: No discharge.     Extraocular Movements: Extraocular movements intact.     Right eye: Normal extraocular motion.     Left eye: Normal extraocular motion.     Conjunctiva/sclera: Conjunctivae normal.  Cardiovascular:     Rate and Rhythm: Normal rate and regular rhythm.     Heart sounds: Normal heart sounds. No murmur heard.    No friction rub. No gallop.  Pulmonary:     Effort: Pulmonary effort is normal. No respiratory distress.     Breath sounds: No stridor. No wheezing, rhonchi or rales.  Chest:     Chest wall: No tenderness.  Musculoskeletal:     Cervical back: Normal range of motion and neck supple.  Lymphadenopathy:     Cervical: No cervical adenopathy.  Skin:    General: Skin is warm and dry.  Neurological:     General: No focal deficit present.     Mental Status: She is alert and oriented to person, place, and time.  Psychiatric:        Mood and Affect: Mood normal.        Behavior: Behavior normal.    Results for orders placed or performed during the hospital encounter of 10/31/23 (from the past 24 hours)  POC Covid19/Flu A&B Antigen     Status: None   Collection Time: 10/31/23 10:44 AM  Result Value Ref Range   Influenza A Antigen, POC Negative Negative   Influenza B Antigen, POC Negative Negative   Covid Antigen, POC Negative Negative    Assessment and Plan :   PDMP not reviewed this encounter.  1. Allergic rhinitis, unspecified seasonality, unspecified trigger   2. Viral respiratory infection    Recommended a oral prednisone course for an acute on chronic allergic rhinitis.  Emphasized need to be more consistent with the allergy medication including her nasal spray, antihistamine.  Otherwise, suspect a viral  respiratory infection.  Recommend general supportive care.  Deferred imaging given clear cardiopulmonary exam, hemodynamically stable vital signs.  Counseled patient on potential for adverse effects with medications prescribed/recommended today, ER and return-to-clinic precautions discussed, patient verbalized understanding.    Adolph Hoop, PA-C 10/31/23 1110
